# Patient Record
Sex: Female | Born: 1945
Health system: Southern US, Community
[De-identification: ages and names within clinical notes are randomized; demographics above are authoritative.]

## PROBLEM LIST (undated history)

## (undated) DIAGNOSIS — H269 Unspecified cataract: Secondary | ICD-10-CM

## (undated) DIAGNOSIS — T7840XA Allergy, unspecified, initial encounter: Secondary | ICD-10-CM

## (undated) DIAGNOSIS — E785 Hyperlipidemia, unspecified: Secondary | ICD-10-CM

## (undated) DIAGNOSIS — N281 Cyst of kidney, acquired: Secondary | ICD-10-CM

## (undated) DIAGNOSIS — K648 Other hemorrhoids: Secondary | ICD-10-CM

## (undated) DIAGNOSIS — A6920 Lyme disease, unspecified: Secondary | ICD-10-CM

## (undated) DIAGNOSIS — C801 Malignant (primary) neoplasm, unspecified: Secondary | ICD-10-CM

## (undated) DIAGNOSIS — K922 Gastrointestinal hemorrhage, unspecified: Secondary | ICD-10-CM

## (undated) DIAGNOSIS — Z860101 Personal history of adenomatous and serrated colon polyps: Secondary | ICD-10-CM

## (undated) DIAGNOSIS — E041 Nontoxic single thyroid nodule: Secondary | ICD-10-CM

## (undated) DIAGNOSIS — Z86010 Personal history of colonic polyps: Secondary | ICD-10-CM

## (undated) DIAGNOSIS — M8580 Other specified disorders of bone density and structure, unspecified site: Secondary | ICD-10-CM

## (undated) DIAGNOSIS — K5730 Diverticulosis of large intestine without perforation or abscess without bleeding: Secondary | ICD-10-CM

## (undated) HISTORY — DX: Diverticulosis of large intestine without perforation or abscess without bleeding: K57.30

## (undated) HISTORY — DX: Personal history of adenomatous and serrated colon polyps: Z86.0101

## (undated) HISTORY — PX: FRACTURE SURGERY: SHX138

## (undated) HISTORY — DX: Personal history of colonic polyps: Z86.010

## (undated) HISTORY — PX: COSMETIC SURGERY: SHX468

## (undated) HISTORY — DX: Allergy, unspecified, initial encounter: T78.40XA

## (undated) HISTORY — DX: Lyme disease, unspecified: A69.20

## (undated) HISTORY — PX: OTHER SURGICAL HISTORY: SHX169

## (undated) HISTORY — PX: RHINOPLASTY: SUR1284

## (undated) HISTORY — PX: COLONOSCOPY: SHX174

## (undated) HISTORY — DX: Gastrointestinal hemorrhage, unspecified: K92.2

## (undated) HISTORY — PX: WRIST FRACTURE SURGERY: SHX121

## (undated) HISTORY — DX: Other hemorrhoids: K64.8

## (undated) HISTORY — DX: Cyst of kidney, acquired: N28.1

## (undated) HISTORY — DX: Other specified disorders of bone density and structure, unspecified site: M85.80

## (undated) HISTORY — DX: Nontoxic single thyroid nodule: E04.1

## (undated) HISTORY — DX: Malignant (primary) neoplasm, unspecified: C80.1

## (undated) HISTORY — PX: SPINE SURGERY: SHX786

## (undated) HISTORY — DX: Hyperlipidemia, unspecified: E78.5

## (undated) HISTORY — DX: Unspecified cataract: H26.9

---

## 1996-03-23 HISTORY — PX: ABDOMINAL HYSTERECTOMY: SHX81

## 2001-04-07 ENCOUNTER — Encounter: Payer: Self-pay | Admitting: Sports Medicine

## 2001-04-07 ENCOUNTER — Encounter: Admission: RE | Admit: 2001-04-07 | Discharge: 2001-04-07 | Payer: Self-pay | Admitting: Sports Medicine

## 2001-04-22 HISTORY — PX: HEMORROIDECTOMY: SUR656

## 2001-05-21 ENCOUNTER — Encounter (INDEPENDENT_AMBULATORY_CARE_PROVIDER_SITE_OTHER): Payer: Self-pay | Admitting: Specialist

## 2001-05-21 ENCOUNTER — Encounter (INDEPENDENT_AMBULATORY_CARE_PROVIDER_SITE_OTHER): Payer: Self-pay | Admitting: *Deleted

## 2001-05-21 ENCOUNTER — Observation Stay: Admission: RE | Admit: 2001-05-21 | Discharge: 2001-05-22 | Payer: Self-pay | Admitting: General Surgery

## 2002-03-15 ENCOUNTER — Other Ambulatory Visit: Admission: RE | Admit: 2002-03-15 | Discharge: 2002-03-15 | Payer: Self-pay | Admitting: Family Medicine

## 2002-04-12 ENCOUNTER — Encounter: Admission: RE | Admit: 2002-04-12 | Discharge: 2002-04-12 | Payer: Self-pay | Admitting: Family Medicine

## 2002-04-12 ENCOUNTER — Encounter: Payer: Self-pay | Admitting: Family Medicine

## 2003-03-29 ENCOUNTER — Other Ambulatory Visit: Admission: RE | Admit: 2003-03-29 | Discharge: 2003-03-29 | Payer: Self-pay | Admitting: Family Medicine

## 2003-04-19 ENCOUNTER — Encounter (INDEPENDENT_AMBULATORY_CARE_PROVIDER_SITE_OTHER): Payer: Self-pay | Admitting: *Deleted

## 2003-04-19 ENCOUNTER — Encounter: Payer: Self-pay | Admitting: Family Medicine

## 2003-04-19 ENCOUNTER — Encounter: Admission: RE | Admit: 2003-04-19 | Discharge: 2003-04-19 | Payer: Self-pay | Admitting: Family Medicine

## 2004-05-18 ENCOUNTER — Encounter: Admission: RE | Admit: 2004-05-18 | Discharge: 2004-05-18 | Payer: Self-pay | Admitting: Family Medicine

## 2004-06-13 ENCOUNTER — Encounter: Admission: RE | Admit: 2004-06-13 | Discharge: 2004-06-13 | Payer: Self-pay | Admitting: Family Medicine

## 2004-06-13 ENCOUNTER — Encounter (INDEPENDENT_AMBULATORY_CARE_PROVIDER_SITE_OTHER): Payer: Self-pay | Admitting: *Deleted

## 2005-05-07 ENCOUNTER — Other Ambulatory Visit: Admission: RE | Admit: 2005-05-07 | Discharge: 2005-05-07 | Payer: Self-pay | Admitting: Family Medicine

## 2005-05-07 ENCOUNTER — Ambulatory Visit: Payer: Self-pay | Admitting: Family Medicine

## 2005-05-17 ENCOUNTER — Ambulatory Visit: Payer: Self-pay | Admitting: Family Medicine

## 2005-05-21 ENCOUNTER — Ambulatory Visit (HOSPITAL_COMMUNITY): Admission: RE | Admit: 2005-05-21 | Discharge: 2005-05-21 | Payer: Self-pay | Admitting: Family Medicine

## 2005-05-22 ENCOUNTER — Ambulatory Visit: Payer: Self-pay | Admitting: Family Medicine

## 2005-05-22 LAB — FECAL OCCULT BLOOD, GUAIAC: Fecal Occult Blood: NEGATIVE

## 2005-05-31 ENCOUNTER — Encounter: Admission: RE | Admit: 2005-05-31 | Discharge: 2005-05-31 | Payer: Self-pay | Admitting: Family Medicine

## 2006-03-19 ENCOUNTER — Ambulatory Visit: Payer: Self-pay | Admitting: Family Medicine

## 2006-03-20 ENCOUNTER — Encounter: Admission: RE | Admit: 2006-03-20 | Discharge: 2006-03-20 | Payer: Self-pay | Admitting: Family Medicine

## 2006-05-14 ENCOUNTER — Encounter: Payer: Self-pay | Admitting: Family Medicine

## 2006-05-14 ENCOUNTER — Ambulatory Visit: Payer: Self-pay | Admitting: Family Medicine

## 2006-05-14 ENCOUNTER — Other Ambulatory Visit: Admission: RE | Admit: 2006-05-14 | Discharge: 2006-05-14 | Payer: Self-pay | Admitting: Family Medicine

## 2006-05-14 LAB — CONVERTED CEMR LAB: Pap Smear: NORMAL

## 2006-05-29 ENCOUNTER — Encounter: Admission: RE | Admit: 2006-05-29 | Discharge: 2006-05-29 | Payer: Self-pay | Admitting: Family Medicine

## 2006-06-17 ENCOUNTER — Encounter: Admission: RE | Admit: 2006-06-17 | Discharge: 2006-06-17 | Payer: Self-pay | Admitting: Family Medicine

## 2006-06-20 ENCOUNTER — Ambulatory Visit: Payer: Self-pay | Admitting: Internal Medicine

## 2006-07-16 ENCOUNTER — Encounter (INDEPENDENT_AMBULATORY_CARE_PROVIDER_SITE_OTHER): Payer: Self-pay | Admitting: *Deleted

## 2006-07-16 ENCOUNTER — Ambulatory Visit: Payer: Self-pay | Admitting: Internal Medicine

## 2006-09-17 ENCOUNTER — Ambulatory Visit: Payer: Self-pay | Admitting: Family Medicine

## 2006-10-01 ENCOUNTER — Ambulatory Visit: Payer: Self-pay | Admitting: Family Medicine

## 2007-06-23 DIAGNOSIS — A6920 Lyme disease, unspecified: Secondary | ICD-10-CM

## 2007-06-23 HISTORY — DX: Lyme disease, unspecified: A69.20

## 2007-07-21 ENCOUNTER — Encounter: Payer: Self-pay | Admitting: Family Medicine

## 2007-07-21 DIAGNOSIS — R799 Abnormal finding of blood chemistry, unspecified: Secondary | ICD-10-CM | POA: Insufficient documentation

## 2007-07-21 DIAGNOSIS — K5909 Other constipation: Secondary | ICD-10-CM | POA: Insufficient documentation

## 2007-07-21 DIAGNOSIS — M8580 Other specified disorders of bone density and structure, unspecified site: Secondary | ICD-10-CM | POA: Insufficient documentation

## 2007-07-21 DIAGNOSIS — E785 Hyperlipidemia, unspecified: Secondary | ICD-10-CM | POA: Insufficient documentation

## 2007-07-21 DIAGNOSIS — Z85828 Personal history of other malignant neoplasm of skin: Secondary | ICD-10-CM | POA: Insufficient documentation

## 2007-07-21 DIAGNOSIS — M353 Polymyalgia rheumatica: Secondary | ICD-10-CM | POA: Insufficient documentation

## 2007-07-21 DIAGNOSIS — K649 Unspecified hemorrhoids: Secondary | ICD-10-CM | POA: Insufficient documentation

## 2007-08-10 ENCOUNTER — Encounter: Payer: Self-pay | Admitting: Family Medicine

## 2007-08-10 ENCOUNTER — Other Ambulatory Visit: Admission: RE | Admit: 2007-08-10 | Discharge: 2007-08-10 | Payer: Self-pay | Admitting: Family Medicine

## 2007-08-10 ENCOUNTER — Ambulatory Visit: Payer: Self-pay | Admitting: Family Medicine

## 2007-08-10 DIAGNOSIS — Z8619 Personal history of other infectious and parasitic diseases: Secondary | ICD-10-CM | POA: Insufficient documentation

## 2007-08-10 DIAGNOSIS — A6920 Lyme disease, unspecified: Secondary | ICD-10-CM | POA: Insufficient documentation

## 2007-08-11 LAB — CONVERTED CEMR LAB
ALT: 21 units/L (ref 0–35)
AST: 26 units/L (ref 0–37)
Albumin: 4.4 g/dL (ref 3.5–5.2)
Alkaline Phosphatase: 90 units/L (ref 39–117)
BUN: 11 mg/dL (ref 6–23)
Basophils Absolute: 0 10*3/uL (ref 0.0–0.1)
Basophils Relative: 0.4 % (ref 0.0–1.0)
Bilirubin, Direct: 0.1 mg/dL (ref 0.0–0.3)
CO2: 31 meq/L (ref 19–32)
Calcium: 9.7 mg/dL (ref 8.4–10.5)
Chloride: 105 meq/L (ref 96–112)
Cholesterol: 225 mg/dL (ref 0–200)
Creatinine, Ser: 0.8 mg/dL (ref 0.4–1.2)
Direct LDL: 138.8 mg/dL
Eosinophils Absolute: 0 10*3/uL (ref 0.0–0.6)
Eosinophils Relative: 0.5 % (ref 0.0–5.0)
GFR calc Af Amer: 93 mL/min
GFR calc non Af Amer: 77 mL/min
Glucose, Bld: 101 mg/dL — ABNORMAL HIGH (ref 70–99)
HCT: 40.4 % (ref 36.0–46.0)
HDL: 71.1 mg/dL (ref >39.0)
Hemoglobin: 13.8 g/dL (ref 12.0–15.0)
Lymphocytes Relative: 23.3 % (ref 12.0–46.0)
MCHC: 34.3 g/dL (ref 30.0–36.0)
MCV: 84.7 fL (ref 78.0–100.0)
Monocytes Absolute: 0.4 10*3/uL (ref 0.2–0.7)
Monocytes Relative: 6.5 % (ref 3.0–11.0)
Neutro Abs: 4.2 10*3/uL (ref 1.4–7.7)
Neutrophils Relative %: 69.3 % (ref 43.0–77.0)
Platelets: 212 10*3/uL (ref 150–400)
Potassium: 4.4 meq/L (ref 3.5–5.1)
RBC: 4.77 M/uL (ref 3.87–5.11)
RDW: 13.3 % (ref 11.5–14.6)
Sodium: 143 meq/L (ref 135–145)
TSH: 1.57 microintl units/mL (ref 0.35–5.50)
Total Bilirubin: 0.9 mg/dL (ref 0.3–1.2)
Total CHOL/HDL Ratio: 3.2
Total Protein: 7.4 g/dL (ref 6.0–8.3)
Triglycerides: 69 mg/dL (ref 0–149)
VLDL: 14 mg/dL (ref 0–40)
WBC: 6 10*3/uL (ref 4.5–10.5)

## 2007-09-08 ENCOUNTER — Telehealth: Payer: Self-pay | Admitting: Family Medicine

## 2007-09-08 ENCOUNTER — Encounter: Admission: RE | Admit: 2007-09-08 | Discharge: 2007-09-08 | Payer: Self-pay | Admitting: Family Medicine

## 2007-09-10 ENCOUNTER — Encounter (INDEPENDENT_AMBULATORY_CARE_PROVIDER_SITE_OTHER): Payer: Self-pay | Admitting: *Deleted

## 2008-06-27 ENCOUNTER — Encounter: Payer: Self-pay | Admitting: Family Medicine

## 2008-06-30 ENCOUNTER — Encounter: Payer: Self-pay | Admitting: Family Medicine

## 2008-08-11 ENCOUNTER — Ambulatory Visit: Payer: Self-pay | Admitting: Family Medicine

## 2008-08-12 LAB — CONVERTED CEMR LAB
ALT: 30 units/L (ref 0–35)
AST: 32 units/L (ref 0–37)
Cholesterol: 208 mg/dL (ref 0–200)
Direct LDL: 110 mg/dL
HDL: 66.9 mg/dL (ref >39.0)
Total CHOL/HDL Ratio: 3.1
Triglycerides: 50 mg/dL (ref 0–149)
VLDL: 10 mg/dL (ref 0–40)

## 2008-08-23 LAB — HM DEXA SCAN

## 2008-09-08 ENCOUNTER — Encounter: Admission: RE | Admit: 2008-09-08 | Discharge: 2008-09-08 | Payer: Self-pay | Admitting: Family Medicine

## 2008-09-16 ENCOUNTER — Encounter: Payer: Self-pay | Admitting: Family Medicine

## 2009-06-16 ENCOUNTER — Encounter: Payer: Self-pay | Admitting: Family Medicine

## 2009-06-22 ENCOUNTER — Ambulatory Visit (HOSPITAL_BASED_OUTPATIENT_CLINIC_OR_DEPARTMENT_OTHER): Admission: RE | Admit: 2009-06-22 | Discharge: 2009-06-23 | Payer: Self-pay | Admitting: Orthopedic Surgery

## 2009-06-29 ENCOUNTER — Encounter: Payer: Self-pay | Admitting: Family Medicine

## 2009-07-07 ENCOUNTER — Encounter: Payer: Self-pay | Admitting: Family Medicine

## 2009-07-24 ENCOUNTER — Encounter: Payer: Self-pay | Admitting: Family Medicine

## 2009-08-04 ENCOUNTER — Encounter: Payer: Self-pay | Admitting: Family Medicine

## 2009-08-11 ENCOUNTER — Other Ambulatory Visit: Admission: RE | Admit: 2009-08-11 | Discharge: 2009-08-11 | Payer: Self-pay | Admitting: Family Medicine

## 2009-08-11 ENCOUNTER — Ambulatory Visit: Payer: Self-pay | Admitting: Family Medicine

## 2009-08-11 DIAGNOSIS — N949 Unspecified condition associated with female genital organs and menstrual cycle: Secondary | ICD-10-CM | POA: Insufficient documentation

## 2009-08-11 LAB — CONVERTED CEMR LAB
KOH Prep: NEGATIVE
Whiff Test: NEGATIVE

## 2009-08-11 LAB — HM PAP SMEAR

## 2009-08-15 ENCOUNTER — Encounter (INDEPENDENT_AMBULATORY_CARE_PROVIDER_SITE_OTHER): Payer: Self-pay | Admitting: *Deleted

## 2009-08-15 LAB — CONVERTED CEMR LAB
ALT: 24 units/L (ref 0–35)
AST: 32 units/L (ref 0–37)
Albumin: 4.6 g/dL (ref 3.5–5.2)
Alkaline Phosphatase: 95 units/L (ref 39–117)
BUN: 16 mg/dL (ref 6–23)
Basophils Absolute: 0 10*3/uL (ref 0.0–0.1)
Basophils Relative: 0.1 % (ref 0.0–3.0)
Bilirubin, Direct: 0 mg/dL (ref 0.0–0.3)
CO2: 31 meq/L (ref 19–32)
Calcium: 9.5 mg/dL (ref 8.4–10.5)
Chloride: 108 meq/L (ref 96–112)
Cholesterol: 262 mg/dL — ABNORMAL HIGH (ref 0–200)
Creatinine, Ser: 0.9 mg/dL (ref 0.4–1.2)
Direct LDL: 166.7 mg/dL
Eosinophils Absolute: 0 10*3/uL (ref 0.0–0.7)
Eosinophils Relative: 1 % (ref 0.0–5.0)
GFR calc non Af Amer: 66.9 mL/min (ref >60)
Glucose, Bld: 101 mg/dL — ABNORMAL HIGH (ref 70–99)
HCT: 38 % (ref 36.0–46.0)
HDL: 73.3 mg/dL (ref >39.00)
Hemoglobin: 12.7 g/dL (ref 12.0–15.0)
Lymphocytes Relative: 29.1 % (ref 12.0–46.0)
Lymphs Abs: 1.3 10*3/uL (ref 0.7–4.0)
MCHC: 33.5 g/dL (ref 30.0–36.0)
MCV: 87.7 fL (ref 78.0–100.0)
Monocytes Absolute: 0.5 10*3/uL (ref 0.1–1.0)
Monocytes Relative: 10.6 % (ref 3.0–12.0)
Neutro Abs: 2.5 10*3/uL (ref 1.4–7.7)
Neutrophils Relative %: 59.2 % (ref 43.0–77.0)
Platelets: 173 10*3/uL (ref 150.0–400.0)
Potassium: 4.4 meq/L (ref 3.5–5.1)
RBC: 4.33 M/uL (ref 3.87–5.11)
RDW: 14.4 % (ref 11.5–14.6)
Sodium: 144 meq/L (ref 135–145)
TSH: 1.51 microintl units/mL (ref 0.35–5.50)
Total Bilirubin: 0.9 mg/dL (ref 0.3–1.2)
Total CHOL/HDL Ratio: 4
Total Protein: 7.6 g/dL (ref 6.0–8.3)
Triglycerides: 89 mg/dL (ref 0.0–149.0)
VLDL: 17.8 mg/dL (ref 0.0–40.0)
Vit D, 25-Hydroxy: 49 ng/mL (ref 30–89)
WBC: 4.3 10*3/uL — ABNORMAL LOW (ref 4.5–10.5)

## 2009-09-01 ENCOUNTER — Encounter: Payer: Self-pay | Admitting: Family Medicine

## 2009-09-11 ENCOUNTER — Encounter: Admission: RE | Admit: 2009-09-11 | Discharge: 2009-09-11 | Payer: Self-pay | Admitting: Family Medicine

## 2009-09-12 ENCOUNTER — Telehealth: Payer: Self-pay | Admitting: Family Medicine

## 2009-09-13 ENCOUNTER — Encounter (INDEPENDENT_AMBULATORY_CARE_PROVIDER_SITE_OTHER): Payer: Self-pay | Admitting: *Deleted

## 2009-10-02 ENCOUNTER — Encounter: Payer: Self-pay | Admitting: Family Medicine

## 2009-11-02 ENCOUNTER — Encounter: Payer: Self-pay | Admitting: Family Medicine

## 2009-11-13 ENCOUNTER — Ambulatory Visit: Payer: Self-pay | Admitting: Family Medicine

## 2009-11-13 ENCOUNTER — Telehealth: Payer: Self-pay | Admitting: Family Medicine

## 2009-11-13 DIAGNOSIS — N63 Unspecified lump in unspecified breast: Secondary | ICD-10-CM | POA: Insufficient documentation

## 2009-11-14 LAB — CONVERTED CEMR LAB
ALT: 21 units/L (ref 0–35)
AST: 25 units/L (ref 0–37)
Cholesterol: 222 mg/dL — ABNORMAL HIGH (ref 0–200)
Direct LDL: 136.1 mg/dL
HDL: 73.6 mg/dL (ref >39.00)
Total CHOL/HDL Ratio: 3
Triglycerides: 72 mg/dL (ref 0.0–149.0)
VLDL: 14.4 mg/dL (ref 0.0–40.0)

## 2009-11-17 ENCOUNTER — Encounter: Admission: RE | Admit: 2009-11-17 | Discharge: 2009-11-17 | Payer: Self-pay | Admitting: Family Medicine

## 2009-12-14 ENCOUNTER — Encounter: Payer: Self-pay | Admitting: Family Medicine

## 2010-01-04 ENCOUNTER — Encounter: Payer: Self-pay | Admitting: Family Medicine

## 2010-01-16 ENCOUNTER — Ambulatory Visit (HOSPITAL_BASED_OUTPATIENT_CLINIC_OR_DEPARTMENT_OTHER): Admission: RE | Admit: 2010-01-16 | Discharge: 2010-01-16 | Payer: Self-pay | Admitting: Orthopedic Surgery

## 2010-01-23 ENCOUNTER — Encounter: Payer: Self-pay | Admitting: Family Medicine

## 2010-08-09 ENCOUNTER — Ambulatory Visit: Payer: Self-pay | Admitting: Family Medicine

## 2010-08-09 ENCOUNTER — Telehealth (INDEPENDENT_AMBULATORY_CARE_PROVIDER_SITE_OTHER): Payer: Self-pay | Admitting: *Deleted

## 2010-08-09 LAB — CONVERTED CEMR LAB
ALT: 18 units/L (ref 0–35)
AST: 23 units/L (ref 0–37)
Albumin: 4.5 g/dL (ref 3.5–5.2)
Alkaline Phosphatase: 75 units/L (ref 39–117)
BUN: 14 mg/dL (ref 6–23)
Bilirubin, Direct: 0.1 mg/dL (ref 0.0–0.3)
CO2: 33 meq/L — ABNORMAL HIGH (ref 19–32)
Calcium: 9.9 mg/dL (ref 8.4–10.5)
Chloride: 104 meq/L (ref 96–112)
Cholesterol: 244 mg/dL — ABNORMAL HIGH (ref 0–200)
Creatinine, Ser: 0.7 mg/dL (ref 0.4–1.2)
Direct LDL: 160.5 mg/dL
GFR calc non Af Amer: 93.76 mL/min (ref >60)
Glucose, Bld: 87 mg/dL (ref 70–99)
HDL: 68.2 mg/dL (ref >39.00)
Phosphorus: 3.9 mg/dL (ref 2.3–4.6)
Potassium: 4.9 meq/L (ref 3.5–5.1)
Sodium: 143 meq/L (ref 135–145)
TSH: 2.16 microintl units/mL (ref 0.35–5.50)
Total Bilirubin: 0.8 mg/dL (ref 0.3–1.2)
Total CHOL/HDL Ratio: 4
Total Protein: 6.9 g/dL (ref 6.0–8.3)
Triglycerides: 81 mg/dL (ref 0.0–149.0)
VLDL: 16.2 mg/dL (ref 0.0–40.0)

## 2010-08-12 LAB — CONVERTED CEMR LAB: Vit D, 25-Hydroxy: 47 ng/mL (ref 30–89)

## 2010-08-21 ENCOUNTER — Ambulatory Visit: Payer: Self-pay | Admitting: Family Medicine

## 2010-08-21 ENCOUNTER — Telehealth: Payer: Self-pay | Admitting: Internal Medicine

## 2010-08-21 ENCOUNTER — Encounter: Payer: Self-pay | Admitting: Internal Medicine

## 2010-08-21 DIAGNOSIS — K921 Melena: Secondary | ICD-10-CM | POA: Insufficient documentation

## 2010-08-23 DIAGNOSIS — Z860100 Personal history of colon polyps, unspecified: Secondary | ICD-10-CM | POA: Insufficient documentation

## 2010-08-23 DIAGNOSIS — K5730 Diverticulosis of large intestine without perforation or abscess without bleeding: Secondary | ICD-10-CM | POA: Insufficient documentation

## 2010-08-30 ENCOUNTER — Ambulatory Visit: Payer: Self-pay | Admitting: Internal Medicine

## 2010-09-18 ENCOUNTER — Encounter: Admission: RE | Admit: 2010-09-18 | Discharge: 2010-09-18 | Payer: Self-pay | Admitting: Family Medicine

## 2010-09-18 LAB — HM MAMMOGRAPHY: HM Mammogram: NORMAL

## 2010-09-20 ENCOUNTER — Encounter (INDEPENDENT_AMBULATORY_CARE_PROVIDER_SITE_OTHER): Payer: Self-pay | Admitting: *Deleted

## 2010-09-20 ENCOUNTER — Encounter: Payer: Self-pay | Admitting: Family Medicine

## 2010-10-05 ENCOUNTER — Ambulatory Visit: Payer: Self-pay | Admitting: Internal Medicine

## 2010-10-05 ENCOUNTER — Ambulatory Visit (HOSPITAL_COMMUNITY): Admission: RE | Admit: 2010-10-05 | Discharge: 2010-10-05 | Payer: Self-pay | Admitting: Internal Medicine

## 2010-10-05 LAB — HM COLONOSCOPY

## 2010-11-20 ENCOUNTER — Ambulatory Visit: Payer: Self-pay | Admitting: Internal Medicine

## 2010-11-21 ENCOUNTER — Ambulatory Visit: Payer: Self-pay | Admitting: Family Medicine

## 2010-11-21 ENCOUNTER — Telehealth (INDEPENDENT_AMBULATORY_CARE_PROVIDER_SITE_OTHER): Payer: Self-pay | Admitting: *Deleted

## 2010-11-26 LAB — CONVERTED CEMR LAB
ALT: 20 U/L
AST: 26 U/L
Cholesterol: 244 mg/dL — ABNORMAL HIGH
Direct LDL: 142.3 mg/dL
HDL: 77.5 mg/dL
Total CHOL/HDL Ratio: 3
Triglycerides: 57 mg/dL
VLDL: 11.4 mg/dL

## 2011-01-13 ENCOUNTER — Encounter: Payer: Self-pay | Admitting: Family Medicine

## 2011-01-22 NOTE — Letter (Signed)
Summary: [facility]   Imported By: Sherian Rein 01/29/2010 14:22:49  _____________________________________________________________________  External Attachment:    Type:   Image     Comment:   External Document

## 2011-01-22 NOTE — Miscellaneous (Signed)
  Clinical Lists Changes  Observations: Added new observation of MAMMO DUE: 09/2011 (09/20/2010 16:08) Added new observation of DM PROGRESS: N/A (09/20/2010 16:08) Added new observation of DM FSREVIEW: N/A (09/20/2010 16:08) Added new observation of HTN PROGRESS: N/A (09/20/2010 16:08) Added new observation of HTN FSREVIEW: N/A (09/20/2010 16:08) Added new observation of MAMMOGRAM: normal (09/18/2010 16:09)      Prevention & Chronic Care Immunizations   Influenza vaccine: Not documented   Influenza vaccine deferral: patient declined  (08/21/2010)    Tetanus booster: 03/29/2003: Td    Pneumococcal vaccine: Not documented   Pneumococcal vaccine deferral: patient declined  (08/21/2010)    H. zoster vaccine: Not documented   H. zoster vaccine deferral: declined  (08/21/2010)  Colorectal Screening   Hemoccult: Negative  (05/22/2005)    Colonoscopy: Adenomatous Polyp  (12/23/2005)   Colonoscopy due: 12/2010  Other Screening   Pap smear: NEGATIVE FOR INTRAEPITHELIAL LESIONS OR MALIGNANCY.  (08/11/2009)    Mammogram: normal  (09/18/2010)   Mammogram action/deferral: Screening mammogram in 1 year.     (09/08/2008)   Mammogram due: 09/2011    DXA bone density scan: osteopenia  (09/18/2010)   Smoking status: never  (07/21/2007)  Lipids   Total Cholesterol: 244  (08/09/2010)   LDL: DEL  (08/11/2008)   LDL Direct: 160.5  (08/09/2010)   HDL: 68.20  (08/09/2010)   Triglycerides: 81.0  (08/09/2010)    SGOT (AST): 23  (08/09/2010)   SGPT (ALT): 18  (08/09/2010)   Alkaline phosphatase: 75  (08/09/2010)   Total bilirubin: 0.8  (08/09/2010)  Self-Management Support :    Lipid self-management support: Not documented     Preventive Care Screening  Mammogram:    Date:  09/18/2010    Next Due:  09/2011    Results:  normal

## 2011-01-22 NOTE — Procedures (Signed)
Summary: Colon Prep/[facility]  Colon Prep/[facility]   Imported By: Lester Lookeba 09/03/2010 11:25:48  _____________________________________________________________________  External Attachment:    Type:   Image     Comment:   External Document

## 2011-01-22 NOTE — Progress Notes (Signed)
Summary: triage  Phone Note From Other Clinic Call back at [phone]   Caller: Lupita Leash, scheduler Call For: Dr. Juanda Chance Reason for Call: Schedule Patient Appt Summary of Call: Dr. Milinda Antis would like pt seen for blood in stool... ok to see PA or NP Initial call taken by: Vallarie Mare,  August 21, 2010 11:34 AM  Follow-up for Phone Call        Scheduled to see Juanda Chance 08/30/10 9:15.  new patient letter mailed to the patient  Follow-up by: Darcey Nora RN, CGRN,  August 21, 2010 11:52 AM

## 2011-01-22 NOTE — Letter (Signed)
Summary: New Patient letter  [facility]  [address] [place], [place] [address]   Phone: [phone]  Fax: [phone]       08/21/2010 MRN: [id number]  [facility] [place] [address] [place], [place]  [address]  Dear Ms. Soy,  Welcome to the Gastroenterology Division at [facility].    You are scheduled to see Dr.  Juanda Chance  on 08/30/10 at 9:15  on the 3rd floor at [facility], [address]  We ask that you try to arrive at our office 15 minutes prior to your appointment time to allow for check-in.  We would like you to complete the enclosed self-administered evaluation form prior to your visit and bring it with you on the day of your appointment.  We will review it with you.  Also, please bring a complete list of all your medications or, if you prefer, bring the medication bottles and we will list them.  Please bring your insurance card so that we may make a copy of it.  If your insurance requires a referral to see a specialist, please bring your referral form from your primary care physician.  Co-payments are due at the time of your visit and may be paid by cash, check or credit card.     Your office visit will consist of a consult with your physician (includes a physical exam), any laboratory testing he/she may order, scheduling of any necessary diagnostic testing (e.g. x-ray, ultrasound, CT-scan), and scheduling of a procedure (e.g. Endoscopy, Colonoscopy) if required.  Please allow enough time on your schedule to allow for any/all of these possibilities.    If you cannot keep your appointment, please call [phone] to cancel or reschedule prior to your appointment date.  This allows [place] the opportunity to schedule an appointment for another patient in need of care.  If you do not cancel or reschedule by 5 p.m. the business day prior to your appointment date, you will be charged a $50.00 late cancellation/no-show fee.    Thank you for choosing [facility] for your medical needs.  We appreciate the opportunity to care for you.  Please visit [place] at our website  to learn more about our practice.                     Sincerely,                                                             The Gastroenterology Division

## 2011-01-22 NOTE — Assessment & Plan Note (Signed)
Summary: blood in stool/Melissa Blackwell   History of Present Illness Visit Type: Initial Consult Primary GI MD: Lina Sar MD Primary Sulema Braid: Cathey Endow, MD Requesting Zaahir Pickney: Roxy Manns, MD Chief Complaint: BRB on tissue and clothing x 3 every 2 months History of Present Illness:   This is a 65 year old white female with several episodes of painless rectal bleeding of bright red blood which occurs with bowel movements but also when she urinates. The blood is usually on the toilet tissue. The last episode occurred 6 weeks ago. She has a known history of hemorrhoids. In 2002, the patient underwent a hemorrhoidectomy by Dr. Earlene Plater which involved internal as well as external hemorrhoids. A colonoscopy at that time showed adenomatous polyps. A repeat colonoscopy in 2007 again showed tubular adenomas. A CT Scan of the abdomen in June 2005 showed diverticulosis. An upper abdominal ultrasound showed a large right renal cyst. She is chronically constipated, using herbal laxatives on a daily basis. There is no family history of colon cancer.   GI Review of Systems      Denies abdominal pain, acid reflux, belching, bloating, chest pain, dysphagia with liquids, dysphagia with solids, heartburn, loss of appetite, nausea, vomiting, vomiting blood, weight loss, and  weight gain.      Reports hemorrhoids and  rectal bleeding.     Denies anal fissure, black tarry stools, change in bowel habit, constipation, diarrhea, diverticulosis, fecal incontinence, heme positive stool, irritable bowel syndrome, jaundice, light color stool, liver problems, and  rectal pain.    Current Medications (verified): 1)  [place] Herbal Tx (Lyme Disease) 2)  Mvi .Marland Kitchen.. 1 By Mouth Qd 3)  Calcium Carbonate 600 Mg Tabs (Calcium Carbonate) .... One Tablet By Mouth Twice A Day 4)  Vitamin K 2 .... Take 1 Tablet By Mouth Once A Day 5)  Pa Resveratrol 250 Mg Caps (Resveratrol) .... Take 1 Tablet By Mouth Once Daily 6)  Nac .... Take 1 Tablet  By Mouth Once A Day  Allergies (verified): No Known Drug Allergies  Past History:  Past Medical History: Skin cancer, hx of- squamous cell, in situ Hyperlipidemia Osteopenia cardiac work up with stress test 08 [facility] Lyme disease07-08 Lyme specialist-- Dr Abner Greenspan Adenomatous Colon Polyps GI Bleed  Past Surgical History: Reviewed history from 08/23/2010 and no changes required. Hemorrhoidectomy (04/2001) Hysterectomy- partial, fibroids (03/1996) Removed keloid scar Spinal fusion Rhinoplasty  Colonoscopy- polyps, diverticulosis, hemorrhoids (04/2001) Dexa- osteopenia hip (04/2001) [place] Abd- large right kidney cyst (03/2003) Dexa- osteopenia hip, worse (03/2003) Right renal cyst Life line screen- PVD, carotid , heel dexa negative (01/2006) Osteopenia slightly decreased 905/2007) dexa 9/09 LS -.78 (improved) hip - 2.13 (worse)  Family History: Reviewed history from 08/23/2010 and no changes required. Father: deceased- heart problems, CHF Mother: pancreatic cancer, died at 19 of heart disease Siblings:  sister- chol uncle cancer ? uncle with DM Family History of Heart Disease: Father, Maternal Grandfather Family History of Uterine Cancer: Sister  Social History: Reviewed history from 08/11/2008 and no changes required. Marital Status: married Children: 1 daughter Occupation: retired non smoker  no alcohol   Review of Systems       The patient complains of night sweats.  The patient denies allergy/sinus, anemia, anxiety-new, arthritis/joint pain, back pain, blood in urine, breast changes/lumps, change in vision, confusion, cough, coughing up blood, depression-new, fainting, fatigue, fever, headaches-new, hearing problems, heart murmur, heart rhythm changes, itching, menstrual pain, muscle pains/cramps, nosebleeds, pregnancy symptoms, shortness of breath, skin rash, sleeping problems, sore throat, swelling of feet/legs,  swollen lymph glands, thirst - excessive ,  urination - excessive , urination changes/pain, urine leakage, vision changes, and voice change.         Pertinent positive and negative review of systems were noted in the above HPI. All other ROS was otherwise negative.   Vital Signs:  Patient profile:   65 year old female Height:      64 inches Weight:      130.25 pounds BMI:     22.44 Pulse rate:   60 / minute Pulse rhythm:   regular BP sitting:   92 / 58  (left arm) Cuff size:   regular  Vitals Entered By: June McMurray CMA Duncan Dull) (August 30, 2010 9:33 AM)  Physical Exam  General:  Well developed, well nourished, no acute distress. Eyes:  PERRLA, no icterus. Mouth:  No deformity or lesions, dentition normal. Neck:  Supple; no masses or thyromegaly. Lungs:  Clear throughout to auscultation. Heart:  Regular rate and rhythm; no murmurs, rubs,  or bruits. Abdomen:  Soft, relaxed abdomen with normoactive bowel sounds and no tenderness. Liver edge at costal margin. Rectal:  rectal and anoscopic exam reveals large skin tags at the anal orifice, normal sphincter tone, second degree hemorrhoids partially prolapsing through the anal canal which is rather hyperemic, bluish and erythematous. There were small hemorrhoids internally. Stool is Hemoccult negative. Extremities:  No clubbing, cyanosis, edema or deformities noted. Skin:  Intact without significant lesions or rashes. Psych:  Alert and cooperative. Normal mood and affect.   Impression & Recommendations:  Problem # 1:  BLOOD IN STOOL (ICD-578.1)  Patient has low volume bleeding with a history of internal hemorrhoids. She is status post remote hemorrhoidectomy. There are hemorrhoids confirmed today on anoscopic exam. Patient is worried about the possibility of recurrent polyps. She would be due for a recall colonoscopy next year but we will proceed with a colon exam this time. She needs to start [facility] suppositories, one at bedtime.  Orders: ZCOL Banding (ZCOL  Band)  Problem # 2:  COLONIC POLYPS, ADENOMATOUS, HX OF (ICD-V12.72)  Patient had adenomatous polyps in 2000 and again in 2007. We will schedule her for a colonoscopy.  Orders: ZCOL Banding (ZCOL Band)  Patient Instructions: 1)  Anusol-HC suppositories, one at bedtime. 2)  Colonoscopy has been scheduled. 3)  Possible hemorrhoidal band ligation may need to be during her colonoscopy. 4)  Copy sent to : Dr Milinda Antis 5)  The medication list was reviewed and reconciled.  All changed / newly prescribed medications were explained.  A complete medication list was provided to the patient / caregiver. Prescriptions: DULCOLAX 5 MG  TBEC (BISACODYL) Day before procedure take 2 at 3pm and 2 at 8pm.  #4 x 0   Entered by:   Lamona Curl CMA (AAMA)   Authorized by:   Hart Carwin MD   Signed by:   Lamona Curl CMA (AAMA) on 08/30/2010   Method used:   Electronically to        [facility]  [address] [facility]retail[facility]       [address] [place] [address] [place], [place]  [address]       Ph: [phone]       Fax: [phone]   RxID:   [id number] REGLAN 10 MG  TABS (METOCLOPRAMIDE HCL) As per prep instructions.  #2 x 0   Entered by:   Lamona Curl CMA (AAMA)   Authorized by:   Hart Carwin MD  Signed by:   Lamona Curl CMA (AAMA) on 08/30/2010   Method used:   [facility]        [facility]  [address] [facility]       [address] [place] [address]       [place], [place]  [address]       Ph: [phone]       Fax: [phone]   RxID:   [id number] MIRALAX   POWD (POLYETHYLENE GLYCOL 3350) As per prep  instructions.  #255gm x 0   Entered by:   Lamona Curl CMA (AAMA)   Authorized by:   Hart Carwin MD   Signed by:   Lamona Curl CMA (AAMA) on 08/30/2010   Method used:   [facility]        [facility]  [address] [facility]       [address] [place] [address] [place], [place]  [address]       Ph: [phone]       Fax: [phone]   RxID:   [id number] ANUSOL-HC 25 MG SUPP (HYDROCORTISONE ACETATE) Insert 1  suppository into rectum at bedtime as needed for rectal bleeding  #12 x 0   Entered by:   Lamona Curl CMA (AAMA)   Authorized by:   Hart Carwin MD   Signed by:   Lamona Curl CMA (AAMA) on 08/30/2010   Method used:   [facility]        [facility]  [address]       [address] [place] [address]       [place], [place]  [address]       Ph: [phone]       Fax: [phone]   RxID:   [id number]

## 2011-01-22 NOTE — Progress Notes (Signed)
----   Converted from flag ---- ---- 08/09/2010 8:46 AM, Colon Flattery Tower MD wrote: please check vitD and tsh and renal , hepatic, lipids for 272, 733.0 thanks  ---- 08/08/2010 11:43 AM, Mills Koller wrote: Patient is scheduled for a CPX with you, I need lab orders with DX, Please. Thanks, Terri ------------------------------

## 2011-01-22 NOTE — Letter (Signed)
Summary: [facility] Instructions  [facility]  [address] [place], [place] [address]   Phone: [phone]  Fax: [phone]       Melissa Blackwell    1946-07-20    MRN: [id number]       Procedure Day /Date: Friday 09/28/10     Arrival Time: 2:00 pm     Procedure Time: 3:00 pm     Location of Procedure:                    _x _  [facility] (4th Floor)  PREPARATION FOR COLONOSCOPY WITH MIRALAX  Starting 5 days prior to your procedure 09/23/10 do not eat nuts, seeds, popcorn, corn, beans, peas,  salads, or any raw vegetables.  Do not take any fiber supplements (e.g. Metamucil, Citrucel, and Benefiber). ____________________________________________________________________________________________________   THE DAY BEFORE YOUR PROCEDURE         DATE: 09/27/10 DAY: Thursday  1   Drink clear liquids the entire day-NO SOLID FOOD  2   Do not drink anything colored red or purple.  Avoid juices with pulp.  No orange juice.  3   Drink at least 64 oz. (8 glasses) of fluid/clear liquids during the day to prevent dehydration and help the prep work efficiently.  CLEAR LIQUIDS INCLUDE: Water Jello Ice Popsicles Tea (sugar ok, no milk/cream) Powdered fruit flavored drinks Coffee (sugar ok, no milk/cream) Gatorade Juice: apple, white grape, white cranberry  Lemonade Clear bullion, consomm, broth Carbonated beverages (any kind) Strained chicken noodle soup Hard Candy  4   Mix the entire bottle of Miralax with 64 oz. of Gatorade/Powerade in the morning and put in the refrigerator to chill.  5   At 3:00 pm take 2 Dulcolax/Bisacodyl tablets.  6   At 4:30 pm take one Reglan/Metoclopramide tablet.  7  Starting at 5:00 pm drink one 8 oz glass of the Miralax mixture every 15-20 minutes until you have finished drinking the entire 64 oz.  You should finish drinking prep around 7:30 or 8:00 pm.  8   If you are nauseated, you may take the 2nd Reglan/Metoclopramide tablet  at 6:30 pm.        9    At 8:00 pm take 2 more DULCOLAX/Bisacodyl tablets.        THE DAY OF YOUR PROCEDURE      DATE:  09/28/10 DAY: Friday  You may drink clear liquids until 1:00 pm  (2 HOURS BEFORE PROCEDURE).   MEDICATION INSTRUCTIONS  Unless otherwise instructed, you should take regular prescription medications with a small sip of water as early as possible the morning of your procedure.         OTHER INSTRUCTIONS  You will need a responsible adult at least 66 years of age to accompany you and drive you home.   This person must remain in the waiting room during your procedure.  Wear loose fitting clothing that is easily removed.  Leave jewelry and other valuables at home.  However, you may wish to bring a book to read or an iPod/MP3 player to listen to music as you wait for your procedure to start.  Remove all body piercing jewelry and leave at home.  Total time from sign-in until discharge is approximately 2-3 hours.  You should go home directly after your procedure and rest.  You can resume normal activities the day after your procedure.  The day of your procedure you should not:   Drive  Make legal decisions   Operate machinery   Drink alcohol   Return to work  You will receive specific instructions about eating, activities and medications before you leave.   The above instructions have been reviewed and explained to me by   Lamona Curl CMA Duncan Dull)  August 30, 2010 10:39 AM     I fully understand and can verbalize these instructions _____________________________ Date 08/30/10

## 2011-01-22 NOTE — Letter (Signed)
Summary: [facility]   Imported By: Lanelle Bal 12/25/2009 12:33:56  _____________________________________________________________________  External Attachment:    Type:   Image     Comment:   External Document

## 2011-01-22 NOTE — Procedures (Signed)
Summary: Colonoscopy  Patient: Melissa Blackwell Note: All result statuses are Final unless otherwise noted.  Tests: (1) Colonoscopy (COL)   COL Colonoscopy           DONE     [facility]     [address] [place], [place]  [address]           COLONOSCOPY PROCEDURE REPORT           PATIENT:  Melissa Blackwell, Melissa Blackwell  MR#:  [id number]     BIRTHDATE:  02-26-1946, 64 yrs. old  GENDER:  female     ENDOSCOPIST:  Hedwig Morton. Juanda Chance, MD     REF. BY:  Marne A. Milinda Antis, M.D.     PROCEDURE DATE:  10/05/2010     PROCEDURE:  Colonoscopy 57[address]     ASA CLASS:  Class I     INDICATIONS:  hematochezia hx hemorrhoids, s/p hemorrhoidectomy in     2002     tubular adenoma 2002 and 2007     MEDICATIONS:   Versed 10 mg, Fentanyl 100 mcg           DESCRIPTION OF PROCEDURE:   After the risks benefits and     alternatives of the procedure were thoroughly explained, informed     consent was obtained.  Digital rectal exam was performed and     revealed no rectal masses.   The  endoscope was introduced through     the anus and advanced to the cecum, which was identified by both     the appendix and ileocecal valve, without limitations.  The     quality of the prep was adequate, using MiraLax.  The instrument     was then slowly withdrawn as the colon was fully examined.     <<PROCEDUREIMAGES>>           FINDINGS:  Internal hemorrhoids were found in the rectum. Banding     was performed (see image5, image6, and image7). 5 bands applied to     internal hemorrhoids, deployed 7 bands  Mild diverticulosis was     found in the sigmoid colon (see image2).  This was otherwise a     normal examination of the colon (see image3 and image4).     Retroflexion was not performed.  The scope was then withdrawn from     the patient and the procedure completed.           COMPLICATIONS:  None     ENDOSCOPIC IMPRESSION:     1) Internal hemorrhoids in the rectum     2) Mild diverticulosis in the sigmoid colon     3)  Otherwise normal examination     s/p band ligation x 5     RECOMMENDATIONS:     Anusol HC supp, 1 hs     Vicodin as needed for pain     OV 4-6 weeks     REPEAT EXAM:  In 10 year(s) for.           ______________________________     Hedwig Morton. Juanda Chance, MD           CC:           n.     eSIGNED:   Hedwig Morton. Brodie at 10/05/2010 09:37 AM           Carma Lair, [id number]  Note: An exclamation mark (!) indicates a result that was not dispersed into the flowsheet. Document Creation  Date: 10/05/2010 9:37 AM _______________________________________________________________________  (1) Order result status: Final Collection or observation date-time: 10/05/2010 09:26 Requested date-time:  Receipt date-time:  Reported date-time:  Referring Physician:   Ordering Physician: Lina Sar [phone]) Specimen Source:  Source: Launa Grill Order Number: [id number] Lab site:   Appended Document: Colonoscopy    Clinical Lists Changes  Observations: Added new observation of COLONNXTDUE: 09/22/2020 (10/05/2010 9:48)      Appended Document: Colonoscopy Patient already has appointment set up for 11/20/10.

## 2011-01-22 NOTE — Assessment & Plan Note (Signed)
Summary: follow up hemorrhoidal bancing/sheri   History of Present Illness Visit Type: Follow-up Visit Primary GI MD: Lina Sar MD Primary [place]: Cathey Endow, MD Requesting Decarla Siemen: na Chief Complaint: F/u from hemorrhodial banding and colon. Pt denies any GI complaints History of Present Illness:   This is a 65 year old female who is status post colonoscopy and hemorrhoidal banding. She was found to have diverticulosis. She has done well since the  hemorrhoidal banding having only one episode of rectal bleeding approximately 3 weeks ago. She has completed her anusol HC suppositories and cream. She denies being constipated. Her recall colonoscopy will be due in October 2021.   GI Review of Systems      Denies abdominal pain, acid reflux, belching, bloating, chest pain, dysphagia with liquids, dysphagia with solids, heartburn, loss of appetite, nausea, vomiting, vomiting blood, weight loss, and  weight gain.        Denies anal fissure, black tarry stools, change in bowel habit, constipation, diarrhea, diverticulosis, fecal incontinence, heme positive stool, hemorrhoids, irritable bowel syndrome, jaundice, light color stool, liver problems, rectal bleeding, and  rectal pain.    Current Medications (verified): 1)  Cowden Herbal Tx (Lyme Disease) .... Once Daily 2)  Multivitamins   Tabs (Multiple Vitamin) .... One Tablet By Mouth Once Daily 3)  Calcium Carbonate 600 Mg Tabs (Calcium Carbonate) .... One Tablet By Mouth Twice A Day 4)  Vitamin K 2 .... Take 1 Tablet By Mouth Once A Day 5)  Pa Resveratrol 250 Mg Caps (Resveratrol) .... Take 1 Tablet By Mouth Once Daily 6)  Nac .... Take 1 Tablet By Mouth Once A Day 7)  Anusol-Hc 25 Mg Supp (Hydrocortisone Acetate) .... Insert 1 Suppository Into Rectum At Bedtime As Needed For Rectal Bleeding  Allergies (verified): No Known Drug Allergies  Past History:  Past Medical History: Skin cancer, hx of- squamous cell, in  situ Hyperlipidemia Osteopenia cardiac work up with stress test 08 [facility] Lyme disease07-08 Lyme specialist-- Dr Abner Greenspan Adenomatous Colon Polyps GI Bleed  Internal hemorrhoids in the rectum  Mild diverticulosis in the sigmoid colon  Past Surgical History: Hemorrhoidectomy (04/2001) Hysterectomy- partial, fibroids (03/1996) Removed keloid scar Spinal fusion Rhinoplasty Hemorrhoid Banding  Colonoscopy- polyps, diverticulosis, hemorrhoids (04/2001) Dexa- osteopenia hip (04/2001) [place] Abd- large right kidney cyst (03/2003) Dexa- osteopenia hip, worse (03/2003) Right renal cyst Life line screen- PVD, carotid , heel dexa negative (01/2006) Osteopenia slightly decreased 905/2007) dexa 9/09 LS -.78 (improved) hip - 2.13 (worse)  Family History: Father: deceased- heart problems, CHF Mother: pancreatic cancer, died at 41 of heart disease Siblings:  sister- chol uncle cancer ? uncle with DM Family History of Heart Disease: Father, Maternal Grandfather Family History of Uterine Cancer: Sister No FH of Colon Cancer:  Social History: Reviewed history from 08/11/2008 and no changes required. Marital Status: married Children: 1 daughter Occupation: retired non smoker  no alcohol   Review of Systems  The patient denies allergy/sinus, anemia, anxiety-new, arthritis/joint pain, back pain, blood in urine, breast changes/lumps, change in vision, confusion, cough, coughing up blood, depression-new, fainting, fatigue, fever, headaches-new, hearing problems, heart murmur, heart rhythm changes, itching, menstrual pain, muscle pains/cramps, night sweats, nosebleeds, pregnancy symptoms, shortness of breath, skin rash, sleeping problems, sore throat, swelling of feet/legs, swollen lymph glands, thirst - excessive , urination - excessive , urination changes/pain, urine leakage, vision changes, and voice change.         Pertinent positive and negative review of systems were noted in the above  HPI. All other ROS was otherwise negative.   Vital Signs:  Patient profile:   66 year old female Height:      64 inches Weight:      132 pounds BMI:     22.74 BSA:     1.64 Pulse rate:   60 / minute Pulse rhythm:   regular BP sitting:   100 / 64  (left arm) Cuff size:   regular  Vitals Entered By: Ok Anis CMA (November 20, 2010 4:12 PM)  Physical Exam  General:  Well developed, well nourished, no acute distress. Lungs:  Clear throughout to auscultation. Heart:  Regular rate and rhythm; no murmurs, rubs,  or bruits. Abdomen:  Soft, nontender and nondistended. No masses, hepatosplenomegaly or hernias noted. Normal bowel sounds. Rectal:  anoscopic exam reveals large external hemorrhoidal tags which appeared to be erythematous and painful. Normal anal canal. One first-grade hemorrhoid internally. Her other hemorrhoids have been banded and appear fibrosed. There is no bleeding. Stool is Hemoccult-negative. Extremities:  No clubbing, cyanosis, edema or deformities noted.   Impression & Recommendations:  Problem # 1:  BLOOD IN STOOL (ICD-578.1) Patient is status post hemorrhoidal banding 3 weeks ago. She  remains asymptomatic. She will continue a high-fiber diet and return p.r.n.  Problem # 2:  COLONIC POLYPS, ADENOMATOUS, HX OF (ICD-V12.72) A recall colonoscopy will be due in October 2021.  Patient Instructions: 1)  Please schedule a follow-up appointment as needed.  2)  Copy sent to : Roxy Manns, MD 3)  Recall colonoscopy October 2021. 4)  The medication list was reviewed and reconciled.  All changed / newly prescribed medications were explained.  A complete medication list was provided to the patient / caregiver.

## 2011-01-22 NOTE — Op Note (Signed)
Summary: Internal and External Hemorrhoids                         [facility]  Patient:    Melissa Blackwell, Melissa Blackwell                     MRN: [id number] Proc. Date: 05/21/01 Adm. Date:  [id number] Attending:  Mervin Hack                           Operative Report  PREOPERATIVE DIAGNOSIS:  Internal and external hemorrhoids.  POSTOPERATIVE DIAGNOSIS:  Internal and external hemorrhoids.  OPERATION PERFORMED:  Hemorrhoidectomy.  SURGEON:  Timothy E. Earlene Plater, M.D.  ANESTHESIA:  General.  INDICATIONS FOR PROCEDURE:  [name] has had hemorrhoids for years.  She is now 16 and has some evidence of chronic constipation.  I did insist on colonoscopy which was done today, showing diverticulosis.  Three polyps were removed and melanosis coli.  She is prepped and ready for surgery and this has been carefully discussed in the office.  DESCRIPTION OF PROCEDURE:  The patient was brought to the operating room and placed supine.  LMA anesthesia provided.  She was placed in lithotomy. Perianal area inspected, prepped and draped in the usual fashion.  I used a proctoscope to remove the liquid stool from the rectum which was normal to about 15 cm.  The scope was withdrawn and the area reprepped and draped. Hemorrhoids were present in the anterior, right anterior positions, right posterior and left lateral positions.  The area was injected round and about with Marcaine 0.5% with epinephrine mixed 9 to 1 with Wydase.  This was massaged in well.  With the anoscope in place, the right posterior and left lateral hemorrhoids were removed in a standard fashion with the skin ellipsed removing the hemorrhoidal tissue.  Care taken to avoid the sphincter and the wounds were closed with a running 2-0 chromic.  The anterior and right anterior hemorrhoids were band ligated.  There was no other pathology. The procedure was complete and there was no bleeding or complication.  Gelfoam, gauze and a dry  sterile dressing applied.  The patient tolerated the procedure well and was removed to the recovery room in good condition. DD:  05/21/01 TD:  05/21/01 Job: [id number]

## 2011-01-22 NOTE — Letter (Signed)
Summary: Results Follow up Letter  [place] [facility]  [address] [place], [place] [address]   Phone: [phone]  Fax: [phone]    09/20/2010 MRN: [id number]  [facility] [address] [place], [place]  [address]  Dear Ms. Brandl,  The following are the results of your recent test(s):  Test         Result    Pap Smear:        Normal _____  Not Normal _____ Comments: ______________________________________________________ Cholesterol: LDL(Bad cholesterol):         Your goal is less than:         HDL (Good cholesterol):       Your goal is more than: Comments:  ______________________________________________________ Mammogram:        Normal __X__  Not Normal _____ Comments:Repeat in 1 year  ___________________________________________________________________ Hemoccult:        Normal _____  Not normal _______ Comments:    _____________________________________________________________________ Other Tests:    We routinely do not discuss normal results over the telephone.  If you desire a copy of the results, or you have any questions about this information we can discuss them at your next office visit.   Sincerely,    Sharilyn Sites for Dr. Roxy Manns

## 2011-01-22 NOTE — Letter (Signed)
Summary: [facility]   Imported By: Lanelle Bal 01/10/2010 09:06:40  _____________________________________________________________________  External Attachment:    Type:   Image     Comment:   External Document

## 2011-01-22 NOTE — Progress Notes (Signed)
  Phone Note Call from Patient   Summary of Call: needs refil valtrex- written  Follow-up for Phone Call        printed in put in nurse in box for pickup  Follow-up by: Judith Part MD,  November 13, 2009 10:41 AM    Prescriptions: VALTREX 1 GM TABS (VALACYCLOVIR HCL) 1 by mouth two times a day for one day for cold sore  #2 x 3   Entered and Authorized by:   Judith Part MD   Signed by:   Judith Part MD on 11/13/2009   Method used:   Print then Give to Patient   RxID:   [id number]

## 2011-01-22 NOTE — Assessment & Plan Note (Signed)
Summary: 1ST [facility] CPX  CYD   Vital Signs:  Patient profile:   65 year old female Height:      64 inches Weight:      131 pounds BMI:     22.57 Temp:     98.1 degrees F oral Pulse rate:   68 / minute Pulse rhythm:   regular BP sitting:   108 / 66  (left arm) Cuff size:   regular  Vitals Entered By: Lewanda Rife LPN (August 21, 2010 10:46 AM) CC: check up - partial hyst in past  Vision Screening:Left eye w/o correction: 20 / 20 Right Eye w/o correction: 20 / 20 Both eyes w/o correction:  20/ 20        Vision Entered By: Lewanda Rife LPN (August 21, 2010 10:50 AM)  Hearing Screen 25db HL: Left  500 hz: 25db 1000 hz: 25db 2000 hz: 25db 4000 hz: No Response Right  500 hz: 25db 1000 hz: 25db 2000 hz: 25db 4000 hz: No Response  40db HL: Left  4000 hz: No Response Right  4000 hz: 40db    History of Present Illness: here for check up of chronic med problems and to review health mt list   has been feeling ok overall    wt is stable with bmi of 22  108/66  some probs with hearing screen  has been concerned about hearing  thinks her hyperbaric o2 for lyme - that may have aff hearing  misses things on TV  wants to wait on hearing tests ,however  hx of skin ca saw her Criss Rosales -- last thursday- removed 3 lesions -- all benign  wears sun protection  lipids- up significantly with LDL in 160s (from 130s) good hdl 68 has been off the wagon for healthy diet -- too much steak and hamburgers and some butter and some fried food  in past -- brought down with diet change    osteopenia - decided not to take meds  she is using something called power plate - vibration - frequency -- 3 times per week  ? if approved  also is exercising -- going to gym 3 times per week - treadmill and calesthenics / machine  dexa 9/09 vit D level is good at 47 ca and d-- is good with that   colonosc 07- due 5 y for adenomatous polyp has noticed some blood from rectum on toilet tissue    thinks she has hemorroids  does not happen when she is straining  blood is bright red   hyst in past partial -- pap nl 8/10 no symptoms or problems   mamd 11/10 self exam   Td 04 flu- does not get  pneumovax-- want to skip that  zoster - does not want        Allergies (verified): No Known Drug Allergies  Past History:  Past Medical History: Last updated: 08/11/2008 Skin cancer, hx of- squamous cell, in situ Hyperlipidemia Osteopenia cardiac work up with stress test 08 [facility] Lyme disease07-08   Lyme specialist-- Dr Abner Greenspan  Past Surgical History: Last updated: 10/07/2008 Hemorrhoidectomy (04/2001) Hysterectomy- partial, fibroids (03/1996) Removed keloid scar Colonoscopy- polyps, diverticulosis, hemorrhoids (04/2001) Dexa- osteopenia hip (04/2001) [place] Abd- large right kidney cyst (03/2003) Dexa- osteopenia hip, worse (03/2003) Right renal cyst Life line screen- PVD, carotid , heel dexa negative (01/2006) Osteopenia slightly decreased 905/2007) Spinal fusion dexa 9/09 LS -.78 (improved) hip - 2.13 (worse)  Family History: Last updated: 08-13-07 Father: deceased- heart problems, CHF  Mother: pancreatic cancer, died at 59 of heart disease Siblings:  sister- chol uncle cancer ? uncle with DM  Social History: Last updated: 08/11/2008 Marital Status: married Children: 1 daughter Occupation: retired non smoker  no alcohol   Risk Factors: Smoking Status: never (07/21/2007)  Review of Systems General:  Denies fatigue, loss of appetite, and malaise. Eyes:  Denies blurring and eye irritation. CV:  Denies chest pain or discomfort, lightheadness, palpitations, and shortness of breath with exertion. Resp:  Denies cough, shortness of breath, and wheezing. GI:  Complains of bloody stools, change in bowel habits, and hemorrhoids; denies abdominal pain and indigestion. GU:  Denies hematuria. MS:  Denies joint pain, joint redness, and joint  swelling. Derm:  Denies itching, lesion(s), poor wound healing, and rash. Neuro:  Denies numbness and tingling. Psych:  Denies anxiety and depression. Endo:  Denies cold intolerance, excessive thirst, excessive urination, and heat intolerance. Heme:  Denies abnormal bruising and bleeding.  Physical Exam  General:  Well-developed,well-nourished,in no acute distress; alert,appropriate and cooperative throughout examination Head:  normocephalic, atraumatic, and no abnormalities observed.   Eyes:  vision grossly intact, pupils equal, pupils round, and pupils reactive to light.  no conjunctival pallor, injection or icterus  Ears:  R ear normal and L ear normal.   Nose:  no nasal discharge.   Mouth:  pharynx pink and moist.   Neck:  supple with full rom and no masses or thyromegally, no JVD or carotid bruit  Chest Wall:  No deformities, masses, or tenderness noted. Breasts:  No mass, nodules, thickening, tenderness, bulging, retraction, inflamation, nipple discharge or skin changes noted.   Lungs:  Normal respiratory effort, chest expands symmetrically. Lungs are clear to auscultation, no crackles or wheezes. Heart:  Normal rate and regular rhythm. S1 and S2 normal without gallop, murmur, click, rub or other extra sounds. Abdomen:  Bowel sounds positive,abdomen soft and non-tender without masses, organomegaly or hernias noted. no renal bruits  Msk:  No deformity or scoliosis noted of thoracic or lumbar spine.  no acute joint changes  Pulses:  R and L carotid,radial,femoral,dorsalis pedis and posterior tibial pulses are full and equal bilaterally Extremities:  No clubbing, cyanosis, edema, or deformity noted with normal full range of motion of all joints.   Neurologic:  sensation intact to light touch, gait normal, and DTRs symmetrical and normal.   Skin:  Intact without suspicious lesions or rashes Cervical Nodes:  No lymphadenopathy noted Axillary Nodes:  No palpable lymphadenopathy Inguinal  Nodes:  No significant adenopathy Psych:  normal affect, talkative and pleasant    Impression & Recommendations:  Problem # 1:  BLOOD IN STOOL (ICD-578.1) Assessment New ref to GI ? more than would be expected with hemorroids  Orders: Gastroenterology Referral (GI)  Problem # 2:  OTHER SCREENING MAMMOGRAM (ICD-V76.12) Assessment: Comment Only annual mammogram scheduled adv pt to continue regular self breast exams non remarkable breast exam today  Orders: Radiology Referral (Radiology)  Problem # 3:  OSTEOPENIA (ICD-733.90) Assessment: Unchanged sched dexa  pt wants to avoid med rev D level  disc vibration therapy Her updated medication list for this problem includes:    Calcium 500 Mg Tabs (Calcium) ..... One by mouth qd    Calcium Carbonate 600 Mg Tabs (Calcium carbonate) ..... One tablet by mouth twice a day  Orders: Radiology Referral (Radiology)  Problem # 4:  HYPERLIPIDEMIA (ICD-272.4) Assessment: Deteriorated  this is worse with worse diet rev low sat fat diet  re check 3 mo  Labs Reviewed: SGOT: 23 (08/09/2010)   SGPT: 18 (08/09/2010)   HDL:68.20 (08/09/2010), 73.60 (11/13/2009)  LDL:DEL (08/11/2008), DEL (08/10/2007)  Chol:244 (08/09/2010), 222 (11/13/2009)  Trig:81.0 (08/09/2010), 72.0 (11/13/2009)  Problem # 5:  Preventive Health Care (ICD-V70.0) Assessment: Comment Only pt declines imms today  Complete Medication List: 1)  Calcium 500 Mg Tabs (Calcium) .... One by mouth qd 2)  Cowden Herbal Tx (lyme Disease)  3)  Mvi  .Marland Kitchen.. 1 by mouth qd 4)  Valtrex 1 Gm Tabs (Valacyclovir hcl) .Marland Kitchen.. 1 by mouth two times a day for one day for cold sore as needed 5)  Calcium Carbonate 600 Mg Tabs (Calcium carbonate) .... One tablet by mouth twice a day 6)  Vitamin K 2  .... Take 1 tablet by mouth once a day 7)  Resperatrol 500mg   .... Take 1 tablet by mouth once a day 8)  Nac  .... Take 1 tablet by mouth once a day  Contraindications/Deferment of  Procedures/Staging:    Test/Procedure: FLU VAX    Reason for deferment: patient declined     Test/Procedure: Pneumovax vaccine    Reason for deferment: patient declined     Test/Procedure: Zoster vaccine    Reason for deferment: declined   Patient Instructions: 1)  we will schedule dexa and mammogram at check out  2)  you can raise your HDL (good cholesterol) by increasing exercise and eating omega 3 fatty acid supplement like fish oil or flax seed oil over the counter 3)  you can lower LDL (bad cholesterol) by limiting saturated fats in diet like red meat, fried foods, egg yolks, fatty breakfast meats, high fat dairy products and shellfish 4)  schedule fasting lab in 3 months lipid/ast/alt 5)  we will do GI ref at check out  6)  call back if you want hearing referral   Current Allergies (reviewed today): No known allergies

## 2011-01-22 NOTE — Progress Notes (Signed)
Summary: add lab test  Phone Note Call from Patient Call back at Home Phone [phone]   Caller: Patient Call For: Judith Part MD Summary of Call: pt had labs today, she asked about adding a test to check NK cells, she says her Dr in [place] does this test due to lyme disease and she is to see him next week. Since having blood drawn she wanted to add it. We drew the blood for the test just need the ok to add or not. Thanks  Initial call taken by: Liane Comber CMA Duncan Dull),  November 21, 2010 9:50 AM  Follow-up for Phone Call        you can check with Jamesetta So to be sure- but because of legal issues I do not think I'm allowed to draw labs for another Dr Marland Kitchennon Corinda Gubler) sorry Follow-up by: Judith Part MD,  November 21, 2010 1:13 PM  Additional Follow-up for Phone Call Additional follow up Details #1::        OK patient notified. Additional Follow-up by: Mills Koller,  November 22, 2010 8:10 AM

## 2011-01-22 NOTE — Letter (Signed)
Summary: [facility]   Imported By: Maryln Gottron 07/06/2009 10:59:16  _____________________________________________________________________  External Attachment:    Type:   Image     Comment:   External Document

## 2011-02-27 ENCOUNTER — Other Ambulatory Visit: Payer: Self-pay

## 2011-02-28 ENCOUNTER — Encounter (INDEPENDENT_AMBULATORY_CARE_PROVIDER_SITE_OTHER): Payer: Self-pay | Admitting: *Deleted

## 2011-02-28 ENCOUNTER — Other Ambulatory Visit: Payer: Self-pay | Admitting: Family Medicine

## 2011-02-28 ENCOUNTER — Other Ambulatory Visit (INDEPENDENT_AMBULATORY_CARE_PROVIDER_SITE_OTHER): Payer: Medicare Other

## 2011-02-28 DIAGNOSIS — E785 Hyperlipidemia, unspecified: Secondary | ICD-10-CM

## 2011-02-28 LAB — LIPID PANEL
Cholesterol: 231 mg/dL — ABNORMAL HIGH (ref 0–200)
HDL: 76.1 mg/dL (ref >39.00)
Total CHOL/HDL Ratio: 3
Triglycerides: 76 mg/dL (ref 0.0–149.0)
VLDL: 15.2 mg/dL (ref 0.0–40.0)

## 2011-02-28 LAB — ALT: ALT: 19 U/L (ref 0–35)

## 2011-02-28 LAB — AST: AST: 24 U/L (ref 0–37)

## 2011-02-28 LAB — LDL CHOLESTEROL, DIRECT: Direct LDL: 142.9 mg/dL

## 2011-03-06 ENCOUNTER — Ambulatory Visit (INDEPENDENT_AMBULATORY_CARE_PROVIDER_SITE_OTHER): Payer: Medicare Other | Admitting: Family Medicine

## 2011-03-06 ENCOUNTER — Encounter: Payer: Self-pay | Admitting: Family Medicine

## 2011-03-06 DIAGNOSIS — E785 Hyperlipidemia, unspecified: Secondary | ICD-10-CM

## 2011-03-10 LAB — POCT HEMOGLOBIN-HEMACUE: Hemoglobin: 13 g/dL (ref 12.0–15.0)

## 2011-03-21 NOTE — Assessment & Plan Note (Signed)
Summary: FOLLOW UP AFTER LABS/RI   Vital Signs:  Patient profile:   65 year old female Height:      64 inches Weight:      131 pounds BMI:     22.57 Temp:     98.8 degrees F oral Pulse rate:   80 / minute Pulse rhythm:   regular BP sitting:   96 / 64  (left arm) Cuff size:   regular  Vitals Entered By: Lewanda Rife LPN (March 06, 2011 2:54 PM) CC: follow-up visit after labs   History of Present Illness: here for f/u of hyperlipidemia   has been feeling fair   is back on abx  saw her lyme dz specialist in dec - said her liver was enlarged and she had hyperacitve reflexes put her back on abx -- on zithromax and omnicef and septra together she wants to get back on herbal protocol  f/u planned in april     wt is stable with bmi of 22  96/64 bp today  lipids are very close to last value trig 76 and HDL 76 and LDL 142 in past LDL got as high as the 160s   is good with her diet  oatmeal daily  cheese just when eating out  she does not eat beef very often -- less than once per week  fried chicken on sundays    one egg once per week  a lot of crabmeat   goes to the gym 3 days per week -- treadmill , weights / machines    Allergies (verified): No Known Drug Allergies  Past History:  Past Medical History: Last updated: 11/22/10 Skin cancer, hx of- squamous cell, in situ Hyperlipidemia Osteopenia cardiac work up with stress test 08 [facility] Lyme disease07-08 Lyme specialist-- Dr Abner Greenspan Adenomatous Colon Polyps GI Bleed  Internal hemorrhoids in the rectum  Mild diverticulosis in the sigmoid colon  Past Surgical History: Last updated: Nov 22, 2010 Hemorrhoidectomy (04/2001) Hysterectomy- partial, fibroids (03/1996) Removed keloid scar Spinal fusion Rhinoplasty Hemorrhoid Banding  Colonoscopy- polyps, diverticulosis, hemorrhoids (04/2001) Dexa- osteopenia hip (04/2001) [place] Abd- large right kidney cyst (03/2003) Dexa- osteopenia hip, worse  (03/2003) Right renal cyst Life line screen- PVD, carotid , heel dexa negative (01/2006) Osteopenia slightly decreased 905/2007) dexa 9/09 LS -.78 (improved) hip - 2.13 (worse)  Family History: Last updated: 2010-11-22 Father: deceased- heart problems, CHF Mother: pancreatic cancer, died at 81 of heart disease Siblings:  sister- chol uncle cancer ? uncle with DM Family History of Heart Disease: Father, Maternal Grandfather Family History of Uterine Cancer: Sister No FH of Colon Cancer:  Social History: Last updated: 08/11/2008 Marital Status: married Children: 1 daughter Occupation: retired non smoker  no alcohol   Risk Factors: Smoking Status: never (07/21/2007)  Review of Systems General:  Denies fatigue, loss of appetite, and malaise. Eyes:  Denies blurring and eye irritation. CV:  Denies chest pain or discomfort, palpitations, and shortness of breath with exertion. Resp:  Denies cough, shortness of breath, and wheezing. GI:  Denies abdominal pain, change in bowel habits, indigestion, and nausea. MS:  Denies joint pain and joint swelling. Derm:  Denies itching, lesion(s), and rash. Neuro:  Denies numbness and tingling. Endo:  Denies excessive thirst and excessive urination. Heme:  Denies abnormal bruising and bleeding.  Physical Exam  General:  Well-developed,well-nourished,in no acute distress; alert,appropriate and cooperative throughout examination Head:  normocephalic, atraumatic, and no abnormalities observed.   Eyes:  vision grossly intact, pupils equal, pupils round, and pupils  reactive to light.  no conjunctival pallor, injection or icterus  Neck:  supple with full rom and no masses or thyromegally, no JVD or carotid bruit  Lungs:  Normal respiratory effort, chest expands symmetrically. Lungs are clear to auscultation, no crackles or wheezes. Heart:  Normal rate and regular rhythm. S1 and S2 normal without gallop, murmur, click, rub or other extra  sounds. Abdomen:  Bowel sounds positive,abdomen soft and non-tender without masses, organomegaly or hernias noted. no renal bruits  Msk:  no new joint hcanges  Pulses:  R and L carotid,radial,femoral,dorsalis pedis and posterior tibial pulses are full and equal bilaterally Extremities:  No clubbing, cyanosis, edema, or deformity noted with normal full range of motion of all joints.   Neurologic:  sensation intact to light touch, gait normal, and DTRs symmetrical and normal.   Skin:  Intact without suspicious lesions or rashes Cervical Nodes:  No lymphadenopathy noted Psych:  normal affect, talkative and pleasant    Impression & Recommendations:  Problem # 1:  HYPERLIPIDEMIA (ICD-272.4) Assessment Unchanged  this is essentially unchanged with LDL in 140s and HDL in 70s  will continue to follow rev labs with pt  rev low sat fat diet with pt  plan to cut down on shellfish and fried food re check in fall  disc goals for LDL- do not want this to go higher in terms of CV risk  Labs Reviewed: SGOT: 24 (02/28/2011)   SGPT: 19 (02/28/2011)   HDL:76.10 (02/28/2011), 77.50 (11/21/2010)  LDL:DEL (08/11/2008), DEL (08/10/2007)  Chol:231 (02/28/2011), 244 (11/21/2010)  Trig:76.0 (02/28/2011), 57.0 (11/21/2010)  Problem # 2:  LYME DISEASE (ICD-088.81) Assessment: Comment Only pt currently on 3 abx [place]  hope to come off these in april  Complete Medication List: 1)  [place] Herbal Tx (lyme Disease)  .... Once daily 2)  Multivitamins Tabs (Multiple vitamin) .... One tablet by mouth once daily 3)  Calcium Carbonate 600 Mg Tabs (Calcium carbonate) .... One tablet by mouth twice a day 4)  Vitamin K 2  .... Take 1 tablet by mouth once a day 5)  Pa Resveratrol 250 Mg Caps (Resveratrol) .... Take 2  tablets  by mouth once daily 6)  Nac  .... Take 1 tablet by mouth once a day 7)  Vitamin B12 ?mg (pt Said High Dose)  .... Two injections weekly 8)  Deplin ?mg   .... Take 1 tablet by mouth once a day 9)  Zithromycin ?mg  .... Two daily on mon-wed-fri for 2 weeks then off med 2 weeks. 10)  Omnicef ?mg  .... Two daily on mon-wed-fri for 2 weeks then off med for 2 weeks 11)  Septra?mg  .... Two daily on mon-wed-fri for 2 weeks then off med for 2 weeks 12)  Flagyl ? Mg  .... Two daily on thur and fri of second week.  Patient Instructions: 1)  you can raise your HDL (good cholesterol) by increasing exercise and eating omega 3 fatty acid supplement like fish oil or flax seed oil over the counter 2)  you can lower LDL (bad cholesterol) by limiting saturated fats in diet like red meat, fried foods, egg yolks, fatty breakfast meats, high fat dairy products and shellfish  3)  cut back on the crabmeat and fried food  4)  schedule PE in october with labs prior    Orders Added: 1)  Est. Patient Level III [09811]    Current Allergies (reviewed today): No  known allergies

## 2011-03-31 LAB — POCT HEMOGLOBIN-HEMACUE: Hemoglobin: 12.5 g/dL (ref 12.0–15.0)
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
## 2011-05-10 NOTE — Op Note (Signed)
[facility]  Patient:    Melissa Blackwell, Melissa Blackwell                     MRN: [id number] Proc. Date: 05/21/01 Adm. Date:  [id number] Attending:  Mervin Hack                           Operative Report  PREOPERATIVE DIAGNOSIS:  Internal and external hemorrhoids.  POSTOPERATIVE DIAGNOSIS:  Internal and external hemorrhoids.  OPERATION PERFORMED:  Hemorrhoidectomy.  SURGEON:  Timothy E. Earlene Plater, M.D.  ANESTHESIA:  General.  INDICATIONS FOR PROCEDURE:  [name] has had hemorrhoids for years.  She is now 4 and has some evidence of chronic constipation.  I did insist on colonoscopy which was done today, showing diverticulosis.  Three polyps were removed and melanosis coli.  She is prepped and ready for surgery and this has been carefully discussed in the office.  DESCRIPTION OF PROCEDURE:  The patient was brought to the operating room and placed supine.  LMA anesthesia provided.  She was placed in lithotomy. Perianal area inspected, prepped and draped in the usual fashion.  I used a proctoscope to remove the liquid stool from the rectum which was normal to about 15 cm.  The scope was withdrawn and the area reprepped and draped. Hemorrhoids were present in the anterior, right anterior positions, right posterior and left lateral positions.  The area was injected round and about with Marcaine 0.5% with epinephrine mixed 9 to 1 with Wydase.  This was massaged in well.  With the anoscope in place, the right posterior and left lateral hemorrhoids were removed in a standard fashion with the skin ellipsed removing the hemorrhoidal tissue.  Care taken to avoid the sphincter and the wounds were closed with a running 2-0 chromic.  The anterior and right anterior hemorrhoids were band ligated.  There was no other pathology. The procedure was complete and there was no bleeding or complication.  Gelfoam, gauze and a dry sterile dressing applied.  The patient tolerated  the procedure well and was removed to the recovery room in good condition. DD:  05/21/01 TD:  05/21/01 Job: [id number]

## 2011-07-09 ENCOUNTER — Encounter: Payer: Self-pay | Admitting: Internal Medicine

## 2011-09-26 ENCOUNTER — Other Ambulatory Visit: Payer: Medicare Other

## 2011-10-01 ENCOUNTER — Encounter: Payer: Medicare Other | Admitting: Family Medicine

## 2011-10-05 ENCOUNTER — Telehealth: Payer: Self-pay | Admitting: Family Medicine

## 2011-10-05 DIAGNOSIS — M899 Disorder of bone, unspecified: Secondary | ICD-10-CM

## 2011-10-05 DIAGNOSIS — Z86010 Personal history of colonic polyps: Secondary | ICD-10-CM

## 2011-10-05 DIAGNOSIS — E785 Hyperlipidemia, unspecified: Secondary | ICD-10-CM

## 2011-10-05 NOTE — Telephone Encounter (Signed)
Message copied by Judy Pimple on Sat Oct 05, 2011 10:15 AM ------      Message from: Baldomero Lamy      Created: Thu Oct 03, 2011 11:32 AM      Regarding: Cpx labs Thurs 10/18       Please order  future cpx labs for pt's upcomming lab appt.      Thanks      Rodney Booze

## 2011-10-10 ENCOUNTER — Other Ambulatory Visit (INDEPENDENT_AMBULATORY_CARE_PROVIDER_SITE_OTHER): Payer: Medicare Other

## 2011-10-10 DIAGNOSIS — M899 Disorder of bone, unspecified: Secondary | ICD-10-CM

## 2011-10-10 DIAGNOSIS — E785 Hyperlipidemia, unspecified: Secondary | ICD-10-CM

## 2011-10-10 DIAGNOSIS — Z86010 Personal history of colonic polyps: Secondary | ICD-10-CM

## 2011-10-10 LAB — TSH: TSH: 1.56 u[IU]/mL (ref 0.35–5.50)

## 2011-10-10 LAB — LIPID PANEL
Cholesterol: 217 mg/dL — ABNORMAL HIGH (ref 0–200)
HDL: 77.2 mg/dL (ref >39.00)
Total CHOL/HDL Ratio: 3
Triglycerides: 55 mg/dL (ref 0.0–149.0)
VLDL: 11 mg/dL (ref 0.0–40.0)

## 2011-10-10 LAB — COMPREHENSIVE METABOLIC PANEL
ALT: 19 U/L (ref 0–35)
AST: 26 U/L (ref 0–37)
Albumin: 4.6 g/dL (ref 3.5–5.2)
Alkaline Phosphatase: 75 U/L (ref 39–117)
BUN: 16 mg/dL (ref 6–23)
CO2: 30 mEq/L (ref 19–32)
Calcium: 9.7 mg/dL (ref 8.4–10.5)
Chloride: 106 mEq/L (ref 96–112)
Creatinine, Ser: 0.8 mg/dL (ref 0.4–1.2)
GFR: 75.05 mL/min (ref >60.00)
Glucose, Bld: 101 mg/dL — ABNORMAL HIGH (ref 70–99)
Potassium: 4.8 mEq/L (ref 3.5–5.1)
Sodium: 143 mEq/L (ref 135–145)
Total Bilirubin: 0.8 mg/dL (ref 0.3–1.2)
Total Protein: 7.5 g/dL (ref 6.0–8.3)

## 2011-10-10 LAB — CBC WITH DIFFERENTIAL/PLATELET
Basophils Absolute: 0 10*3/uL (ref 0.0–0.1)
Basophils Relative: 0.7 % (ref 0.0–3.0)
Eosinophils Absolute: 0 10*3/uL (ref 0.0–0.7)
Eosinophils Relative: 1 % (ref 0.0–5.0)
HCT: 40.4 % (ref 36.0–46.0)
Hemoglobin: 13.5 g/dL (ref 12.0–15.0)
Lymphocytes Relative: 30.1 % (ref 12.0–46.0)
Lymphs Abs: 1 10*3/uL (ref 0.7–4.0)
MCHC: 33.5 g/dL (ref 30.0–36.0)
MCV: 87.6 fl (ref 78.0–100.0)
Monocytes Absolute: 0.3 10*3/uL (ref 0.1–1.0)
Monocytes Relative: 8.8 % (ref 3.0–12.0)
Neutro Abs: 2 10*3/uL (ref 1.4–7.7)
Neutrophils Relative %: 59.4 % (ref 43.0–77.0)
Platelets: 167 10*3/uL (ref 150.0–400.0)
RBC: 4.61 Mil/uL (ref 3.87–5.11)
RDW: 14.4 % (ref 11.5–14.6)
WBC: 3.4 10*3/uL — ABNORMAL LOW (ref 4.5–10.5)

## 2011-10-10 LAB — VITAMIN D 25 HYDROXY (VIT D DEFICIENCY, FRACTURES): Vit D, 25-Hydroxy: 53 ng/mL (ref 30–89)

## 2011-10-10 LAB — LDL CHOLESTEROL, DIRECT: Direct LDL: 129 mg/dL

## 2011-10-14 ENCOUNTER — Encounter: Payer: Self-pay | Admitting: Family Medicine

## 2011-10-15 ENCOUNTER — Encounter: Payer: Self-pay | Admitting: Family Medicine

## 2011-10-15 ENCOUNTER — Ambulatory Visit (INDEPENDENT_AMBULATORY_CARE_PROVIDER_SITE_OTHER): Payer: Medicare Other | Admitting: Family Medicine

## 2011-10-15 VITALS — BP 98/60 | HR 72 | Temp 98.0°F | Ht 63.5 in | Wt 135.0 lb

## 2011-10-15 DIAGNOSIS — M899 Disorder of bone, unspecified: Secondary | ICD-10-CM

## 2011-10-15 DIAGNOSIS — E785 Hyperlipidemia, unspecified: Secondary | ICD-10-CM

## 2011-10-15 DIAGNOSIS — Z86010 Personal history of colonic polyps: Secondary | ICD-10-CM

## 2011-10-15 DIAGNOSIS — Z1231 Encounter for screening mammogram for malignant neoplasm of breast: Secondary | ICD-10-CM | POA: Insufficient documentation

## 2011-10-15 DIAGNOSIS — A6920 Lyme disease, unspecified: Secondary | ICD-10-CM

## 2011-10-15 DIAGNOSIS — M949 Disorder of cartilage, unspecified: Secondary | ICD-10-CM

## 2011-10-15 NOTE — Assessment & Plan Note (Signed)
Rev last year's dexa Due for another in a year Rev ca and vit D- and good exercise Rev safety with fx risk

## 2011-10-15 NOTE — Assessment & Plan Note (Signed)
Improvement with fish oil and red yeast rice (no side eff) and good diet Will continue to follow Disc goals for HDL and LDL  Disc low sat fat diet - reviewed

## 2011-10-15 NOTE — Assessment & Plan Note (Signed)
Pt has specialist from [facility] following with herbal protocol- doing much better now  Will continue to follow and stay active

## 2011-10-15 NOTE — Assessment & Plan Note (Signed)
Scheduled annual screening mammogram Nl breast exam today  Encouraged monthly self exams   

## 2011-10-15 NOTE — Patient Instructions (Signed)
We will schedule you for a mammogram at check out  Keep eating a healthy diet Avoid red meat/ fried foods/ egg yolks/ fatty breakfast meats/ butter, cheese and high fat dairy/ and shellfish   Continue your calcium and vitamin D and exercise

## 2011-10-15 NOTE — Progress Notes (Signed)
Subjective:    Patient ID: ZEYA BALLES, female    DOB: 14-Oct-1946, 65 y.o.   MRN: [id number]  HPI Here for annual check up of chronic medical problems and to review health mt list  Is doing better  Back on herbal regime for lyme- and doing well with that   Will be seeing urologist -- for stream problems- stops and starts -- will get it checked out  Will be doing a urodynamic study Had this before ever starting the herbal meds    bp is 98/60 Wt is up 4 lb with good bmi of 23  Lipids are improved with LDL of 129 Lab Results  Component Value Date   CHOL 217* 10/10/2011   CHOL 231* 02/28/2011   CHOL 244* 11/21/2010   Lab Results  Component Value Date   HDL 77.20 10/10/2011   HDL 76.10 02/28/2011   HDL 77.50 11/21/2010   No results found for this basename: LDLCALC   Lab Results  Component Value Date   TRIG 55.0 10/10/2011   TRIG 76.0 02/28/2011   TRIG 57.0 11/21/2010   Lab Results  Component Value Date   CHOLHDL 3 10/10/2011   CHOLHDL 3 02/28/2011   CHOLHDL 3 11/21/2010   Lab Results  Component Value Date   LDLDIRECT 129.0 10/10/2011   LDLDIRECT 142.9 02/28/2011   LDLDIRECT 142.3 11/21/2010   diet- tries to eat as healthy as she can - does eat some pizza  Eats one chicken wing once a week / occ red meat   Osteopenia  Ca and D-- is very good with that  Also exercising - 3 days per week , and stays active  Last dexa was 9/11 with osteopenia   colonosc 10/11-- no polyps that time -- told her 42 y f/u is fine  Hx of polyps in distant past  Zoster status- cannot get due to fact it is a live vaccine  Pneumovax- does not want to get due to mercury Flu shot- does not want to get due to mercury  Td 04  Mam 9/11- wants to get that set up at the breast center  Self exam -no lumps or problems   Partial hyst in past Pap nl 8/10-- no gyn problems at all   Patient Active Problem List  Diagnoses  . LYME DISEASE  . HYPERLIPIDEMIA  . HEMORRHOIDS  . DIVERTICULOSIS, COLON    . CONSTIPATION, CHRONIC  . UNSPEC SYMPTOM ASSOC W/FEMALE GENITAL ORGANS  . OSTEOPENIA  . RHEUMATOID FACTOR, POSITIVE  . SKIN CANCER, HX OF  . COLONIC POLYPS, ADENOMATOUS, HX OF  . Other screening mammogram   Past Medical History  Diagnosis Date  . Cancer     skin CA Hx of squamous cell in situ  . Hyperlipidemia   . Osteopenia   . Lyme disease 06/2007    Dr Abner Greenspan  . Hx of adenomatous colonic polyps   . GI bleed   . Internal hemorrhoids   . Diverticulosis of sigmoid colon     mild  . Renal cyst     right renal cyst   Past Surgical History  Procedure Date  . Hemorroidectomy 04/2001  . Abdominal hysterectomy 03/1996    partial fibroids  . Scar removal     keloid  scar  . Spine surgery     spinal fusion  . Rhinoplasty   . Hemorrhoid banding    History  Substance Use Topics  . Smoking status: Never Smoker   . Smokeless tobacco: Not  on file  . Alcohol Use: No   Family History  Problem Relation Age of Onset  . Cancer Mother     pancreatic CA  . Heart disease Mother   . Heart disease Father     CHF and heart problems  . Hyperlipidemia Sister   . Cancer Sister     uterine CA  . Heart disease Maternal Grandmother    No Known Allergies Current Outpatient Prescriptions on File Prior to Visit  Medication Sig Dispense Refill  . calcium carbonate (OS-CAL) 600 MG TABS Take 600 mg by mouth 2 (two) times daily with a meal.        . Multiple Vitamin (MULTIVITAMIN) tablet Take 1 tablet by mouth daily.        Marland Kitchen Resveratrol 250 MG CAPS Take 1 capsule by mouth daily.       Marland Kitchen L-Methylfolate (DEPLIN PO) Take 1 tablet by mouth daily.        . MetroNIDAZOLE (FLAGYL PO) Take by mouth. Take two daily on Thursday and Friday of second week.       Marland Kitchen VITAMIN K PO Take 1 tablet by mouth daily.              Review of Systems Review of Systems  Constitutional: Negative for fever, appetite change, fatigue and unexpected weight change.  Eyes: Negative for pain and visual  disturbance.  Respiratory: Negative for cough and shortness of breath.   Cardiovascular: Negative for cp or palpitations    Gastrointestinal: Negative for nausea, diarrhea and constipation.  Genitourinary: Negative for urgency and frequency.  Skin: Negative for pallor or rash   Neurological: Negative for weakness, light-headedness, numbness and headaches.  Hematological: Negative for adenopathy. Does not bruise/bleed easily.  Psychiatric/Behavioral: Negative for dysphoric mood. The patient is not nervous/anxious.          Objective:   Physical Exam  Constitutional: She appears well-developed and well-nourished.  HENT:  Head: Normocephalic and atraumatic.  Right Ear: External ear normal.  Left Ear: External ear normal.  Nose: Nose normal.  Mouth/Throat: Oropharynx is clear and moist.  Eyes: Conjunctivae and EOM are normal. Pupils are equal, round, and reactive to light.  Neck: Normal range of motion. Neck supple. No JVD present. Carotid bruit is not present. No thyromegaly present.  Cardiovascular: Normal rate, regular rhythm, normal heart sounds and intact distal pulses.   Pulmonary/Chest: Effort normal and breath sounds normal. No respiratory distress. She has no wheezes.  Abdominal: Soft. Bowel sounds are normal. She exhibits no distension, no abdominal bruit and no mass. There is no tenderness.  Genitourinary: No breast swelling, tenderness, discharge or bleeding.  Musculoskeletal: Normal range of motion. She exhibits no edema and no tenderness.       No acute joint changes   Lymphadenopathy:    She has no cervical adenopathy.  Neurological: She is alert. She has normal reflexes. No cranial nerve deficit. Coordination normal.  Skin: Skin is warm and dry. No rash noted. No erythema. No pallor.  Psychiatric: She has a normal mood and affect.          Assessment & Plan:

## 2011-10-15 NOTE — Assessment & Plan Note (Signed)
Last colonoscopy had no polyps -- per pt 10 year call back  Was in 10/11 Doing well without stool changes

## 2011-10-29 ENCOUNTER — Ambulatory Visit: Payer: Medicare Other

## 2011-11-06 ENCOUNTER — Ambulatory Visit
Admission: RE | Admit: 2011-11-06 | Discharge: 2011-11-06 | Disposition: A | Discharge status: Home / Self Care | Payer: Medicare Other | Source: Ambulatory Visit | Attending: Family Medicine | Admitting: Family Medicine

## 2011-11-06 DIAGNOSIS — Z1231 Encounter for screening mammogram for malignant neoplasm of breast: Secondary | ICD-10-CM

## 2012-01-02 DIAGNOSIS — R279 Unspecified lack of coordination: Secondary | ICD-10-CM | POA: Diagnosis not present

## 2012-01-02 DIAGNOSIS — R350 Frequency of micturition: Secondary | ICD-10-CM | POA: Diagnosis not present

## 2012-01-06 DIAGNOSIS — Z85828 Personal history of other malignant neoplasm of skin: Secondary | ICD-10-CM | POA: Diagnosis not present

## 2012-01-06 DIAGNOSIS — L821 Other seborrheic keratosis: Secondary | ICD-10-CM | POA: Diagnosis not present

## 2012-01-06 DIAGNOSIS — D1801 Hemangioma of skin and subcutaneous tissue: Secondary | ICD-10-CM | POA: Diagnosis not present

## 2012-01-06 DIAGNOSIS — D485 Neoplasm of uncertain behavior of skin: Secondary | ICD-10-CM | POA: Diagnosis not present

## 2012-01-06 DIAGNOSIS — B0089 Other herpesviral infection: Secondary | ICD-10-CM | POA: Diagnosis not present

## 2012-01-06 DIAGNOSIS — L905 Scar conditions and fibrosis of skin: Secondary | ICD-10-CM | POA: Diagnosis not present

## 2012-01-06 DIAGNOSIS — L820 Inflamed seborrheic keratosis: Secondary | ICD-10-CM | POA: Diagnosis not present

## 2012-01-29 DIAGNOSIS — R350 Frequency of micturition: Secondary | ICD-10-CM | POA: Diagnosis not present

## 2012-01-29 DIAGNOSIS — M62838 Other muscle spasm: Secondary | ICD-10-CM | POA: Diagnosis not present

## 2012-01-29 DIAGNOSIS — M6281 Muscle weakness (generalized): Secondary | ICD-10-CM | POA: Diagnosis not present

## 2012-01-29 DIAGNOSIS — R279 Unspecified lack of coordination: Secondary | ICD-10-CM | POA: Diagnosis not present

## 2012-04-01 DIAGNOSIS — R351 Nocturia: Secondary | ICD-10-CM | POA: Diagnosis not present

## 2012-04-01 DIAGNOSIS — R350 Frequency of micturition: Secondary | ICD-10-CM | POA: Diagnosis not present

## 2012-04-01 DIAGNOSIS — R39198 Other difficulties with micturition: Secondary | ICD-10-CM | POA: Diagnosis not present

## 2012-05-20 DIAGNOSIS — R5381 Other malaise: Secondary | ICD-10-CM | POA: Diagnosis not present

## 2012-05-20 DIAGNOSIS — R5383 Other fatigue: Secondary | ICD-10-CM | POA: Diagnosis not present

## 2012-05-20 DIAGNOSIS — A6920 Lyme disease, unspecified: Secondary | ICD-10-CM | POA: Diagnosis not present

## 2012-08-12 DIAGNOSIS — H2510 Age-related nuclear cataract, unspecified eye: Secondary | ICD-10-CM | POA: Diagnosis not present

## 2012-08-12 DIAGNOSIS — H40019 Open angle with borderline findings, low risk, unspecified eye: Secondary | ICD-10-CM | POA: Diagnosis not present

## 2012-09-25 DIAGNOSIS — H2510 Age-related nuclear cataract, unspecified eye: Secondary | ICD-10-CM | POA: Diagnosis not present

## 2012-09-25 DIAGNOSIS — H40019 Open angle with borderline findings, low risk, unspecified eye: Secondary | ICD-10-CM | POA: Diagnosis not present

## 2012-10-06 ENCOUNTER — Telehealth: Payer: Self-pay | Admitting: Family Medicine

## 2012-10-06 DIAGNOSIS — E785 Hyperlipidemia, unspecified: Secondary | ICD-10-CM | POA: Insufficient documentation

## 2012-10-06 DIAGNOSIS — M899 Disorder of bone, unspecified: Secondary | ICD-10-CM

## 2012-10-06 DIAGNOSIS — M949 Disorder of cartilage, unspecified: Secondary | ICD-10-CM

## 2012-10-06 DIAGNOSIS — K5909 Other constipation: Secondary | ICD-10-CM

## 2012-10-06 DIAGNOSIS — R799 Abnormal finding of blood chemistry, unspecified: Secondary | ICD-10-CM

## 2012-10-06 DIAGNOSIS — Z86010 Personal history of colonic polyps: Secondary | ICD-10-CM

## 2012-10-06 NOTE — Telephone Encounter (Signed)
Message copied by Judy Pimple on Tue Oct 06, 2012  9:20 PM ------      Message from: Alvina Chou      Created: Thu Oct 01, 2012 11:23 AM      Regarding: Lab orders for Wednesday 10.15.13       Patient is scheduled for CPX labs, please order future labs, Thanks , Camelia Eng

## 2012-10-07 ENCOUNTER — Other Ambulatory Visit (INDEPENDENT_AMBULATORY_CARE_PROVIDER_SITE_OTHER): Payer: Medicare Other

## 2012-10-07 DIAGNOSIS — K5909 Other constipation: Secondary | ICD-10-CM | POA: Diagnosis not present

## 2012-10-07 DIAGNOSIS — Z86010 Personal history of colonic polyps: Secondary | ICD-10-CM | POA: Diagnosis not present

## 2012-10-07 DIAGNOSIS — M899 Disorder of bone, unspecified: Secondary | ICD-10-CM | POA: Diagnosis not present

## 2012-10-07 DIAGNOSIS — M949 Disorder of cartilage, unspecified: Secondary | ICD-10-CM

## 2012-10-07 DIAGNOSIS — E785 Hyperlipidemia, unspecified: Secondary | ICD-10-CM

## 2012-10-07 LAB — COMPREHENSIVE METABOLIC PANEL
ALT: 19 U/L (ref 0–35)
AST: 23 U/L (ref 0–37)
Albumin: 4.1 g/dL (ref 3.5–5.2)
Alkaline Phosphatase: 78 U/L (ref 39–117)
BUN: 16 mg/dL (ref 6–23)
CO2: 28 mEq/L (ref 19–32)
Calcium: 9.5 mg/dL (ref 8.4–10.5)
Chloride: 107 mEq/L (ref 96–112)
Creatinine, Ser: 0.7 mg/dL (ref 0.4–1.2)
GFR: 87.11 mL/min (ref >60.00)
Glucose, Bld: 91 mg/dL (ref 70–99)
Potassium: 4.4 mEq/L (ref 3.5–5.1)
Sodium: 142 mEq/L (ref 135–145)
Total Bilirubin: 0.7 mg/dL (ref 0.3–1.2)
Total Protein: 7.2 g/dL (ref 6.0–8.3)

## 2012-10-07 LAB — LIPID PANEL
Cholesterol: 230 mg/dL — ABNORMAL HIGH (ref 0–200)
HDL: 72.3 mg/dL (ref >39.00)
Total CHOL/HDL Ratio: 3
Triglycerides: 64 mg/dL (ref 0.0–149.0)
VLDL: 12.8 mg/dL (ref 0.0–40.0)

## 2012-10-07 LAB — CBC WITH DIFFERENTIAL/PLATELET
Basophils Absolute: 0 10*3/uL (ref 0.0–0.1)
Basophils Relative: 0.5 % (ref 0.0–3.0)
Eosinophils Absolute: 0 10*3/uL (ref 0.0–0.7)
Eosinophils Relative: 1.2 % (ref 0.0–5.0)
HCT: 40.1 % (ref 36.0–46.0)
Hemoglobin: 13.1 g/dL (ref 12.0–15.0)
Lymphocytes Relative: 31.1 % (ref 12.0–46.0)
Lymphs Abs: 1.2 10*3/uL (ref 0.7–4.0)
MCHC: 32.6 g/dL (ref 30.0–36.0)
MCV: 88.5 fl (ref 78.0–100.0)
Monocytes Absolute: 0.4 10*3/uL (ref 0.1–1.0)
Monocytes Relative: 9.1 % (ref 3.0–12.0)
Neutro Abs: 2.3 10*3/uL (ref 1.4–7.7)
Neutrophils Relative %: 58.1 % (ref 43.0–77.0)
Platelets: 184 10*3/uL (ref 150.0–400.0)
RBC: 4.54 Mil/uL (ref 3.87–5.11)
RDW: 13.6 % (ref 11.5–14.6)
WBC: 4 10*3/uL — ABNORMAL LOW (ref 4.5–10.5)

## 2012-10-07 LAB — TSH: TSH: 2.08 u[IU]/mL (ref 0.35–5.50)

## 2012-10-07 LAB — LDL CHOLESTEROL, DIRECT: Direct LDL: 148.5 mg/dL

## 2012-10-08 LAB — VITAMIN D 25 HYDROXY (VIT D DEFICIENCY, FRACTURES): Vit D, 25-Hydroxy: 44 ng/mL (ref 30–89)

## 2012-10-14 ENCOUNTER — Other Ambulatory Visit: Payer: Medicare Other

## 2012-10-21 ENCOUNTER — Ambulatory Visit (INDEPENDENT_AMBULATORY_CARE_PROVIDER_SITE_OTHER): Payer: Medicare Other | Admitting: Family Medicine

## 2012-10-21 ENCOUNTER — Encounter: Payer: Self-pay | Admitting: Family Medicine

## 2012-10-21 VITALS — BP 124/58 | HR 82 | Temp 98.0°F | Ht 63.5 in | Wt 137.2 lb

## 2012-10-21 DIAGNOSIS — M899 Disorder of bone, unspecified: Secondary | ICD-10-CM | POA: Diagnosis not present

## 2012-10-21 DIAGNOSIS — M949 Disorder of cartilage, unspecified: Secondary | ICD-10-CM

## 2012-10-21 DIAGNOSIS — E785 Hyperlipidemia, unspecified: Secondary | ICD-10-CM | POA: Diagnosis not present

## 2012-10-21 DIAGNOSIS — Z1231 Encounter for screening mammogram for malignant neoplasm of breast: Secondary | ICD-10-CM | POA: Diagnosis not present

## 2012-10-21 NOTE — Patient Instructions (Addendum)
Avoid red meat/ fried foods/ egg yolks/ fatty breakfast meats/ butter, cheese and high fat dairy/ and shellfish   If it does not give you side effects - the red yeast rice did seem to help your cholesterol Please send for last dexa report  We will schedule mammogram at check out

## 2012-10-21 NOTE — Progress Notes (Signed)
Subjective:    Patient ID: Melissa Blackwell, female    DOB: [birth year], 66 y.o.   MRN: [id number]  HPI Here for check up of chronic medical conditions and to review health mt list   Is doing fairly well  Is cutting back gradually on her herbal protocol for lyme , is a slow process  Was on herbal protocol for 18 mo  occ a little numbness or night sweat- but not too bad   Wt is up 2 lb with bmi of 23  Lipid Lab Results  Component Value Date   CHOL 230* 10/07/2012   CHOL 217* 10/10/2011   CHOL 231* 02/28/2011   Lab Results  Component Value Date   HDL 72.30 10/07/2012   HDL 77.20 10/10/2011   HDL 76.10 02/28/2011   No results found for this basename: LDLCALC   Lab Results  Component Value Date   TRIG 64.0 10/07/2012   TRIG 55.0 10/10/2011   TRIG 76.0 02/28/2011   Lab Results  Component Value Date   CHOLHDL 3 10/07/2012   CHOLHDL 3 10/10/2011   CHOLHDL 3 02/28/2011   Lab Results  Component Value Date   LDLDIRECT 148.5 10/07/2012   LDLDIRECT 129.0 10/10/2011   LDLDIRECT 142.9 02/28/2011   has not been taking her red yeast rice  Did not give her side effects  Diet is fairly good - no fried or fast food  Also avoids sugar -makes her feel bad   Vaccines- declines flu / zoster / pneumovax -will not disc why  Td 4/04- will wait for next year to get that   mammo 11/12- needs to set that up  Self exam  colonosc 10/11 Had plyps in past   Had hyst   dexa 9/09 osteopenia   Patient Active Problem List  Diagnosis  . LYME DISEASE  . HYPERLIPIDEMIA  . HEMORRHOIDS  . DIVERTICULOSIS, COLON  . CONSTIPATION, CHRONIC  . UNSPEC SYMPTOM ASSOC W/FEMALE GENITAL ORGANS  . OSTEOPENIA  . RHEUMATOID FACTOR, POSITIVE  . SKIN CANCER, HX OF  . COLONIC POLYPS, ADENOMATOUS, HX OF  . Other screening mammogram  . Hyperlipidemia   Past Medical History  Diagnosis Date  . Cancer     skin CA Hx of squamous cell in situ  . Hyperlipidemia   . Osteopenia   . Lyme disease 06/2007    Dr  Abner Greenspan  . Hx of adenomatous colonic polyps   . GI bleed   . Internal hemorrhoids   . Diverticulosis of sigmoid colon     mild  . Renal cyst     right renal cyst   Past Surgical History  Procedure Date  . Hemorroidectomy 04/2001  . Abdominal hysterectomy 03/1996    partial fibroids  . Scar removal     keloid  scar  . Spine surgery     spinal fusion  . Rhinoplasty   . Hemorrhoid banding    History  Substance Use Topics  . Smoking status: Never Smoker   . Smokeless tobacco: Not on file  . Alcohol Use: No   Family History  Problem Relation Age of Onset  . Cancer Mother     pancreatic CA  . Heart disease Mother   . Heart disease Father     CHF and heart problems  . Hyperlipidemia Sister   . Cancer Sister     uterine CA  . Heart disease Maternal Grandmother    No Known Allergies Current Outpatient Prescriptions on File Prior to Visit  Medication Sig Dispense Refill  . calcium carbonate (OS-CAL) 600 MG TABS Take 600 mg by mouth 2 (two) times daily with a meal.        . KRILL OIL PO Take 1 capsule by mouth daily.        . Multiple Vitamin (MULTIVITAMIN) tablet Take 1 tablet by mouth daily.        . NON FORMULARY Cowden herbal protocol for lyme disease as directed       . Probiotic Product (PROBIOTIC PO) Take 1 capsule by mouth daily.        . Red Yeast Rice Extract 600 MG CAPS Take 1 capsule by mouth 2 (two) times daily.        Marland Kitchen Resveratrol 250 MG CAPS Take 1 capsule by mouth daily.            Review of Systems Review of Systems  Constitutional: Negative for fever, appetite change, fatigue and unexpected weight change.  Eyes: Negative for pain and visual disturbance.  Respiratory: Negative for cough and shortness of breath.   Cardiovascular: Negative for cp or palpitations    Gastrointestinal: Negative for nausea, diarrhea and constipation.  Genitourinary: Negative for urgency and frequency.  Skin: Negative for pallor or rash   Neurological: Negative for  weakness, light-headedness, numbness and headaches.  Hematological: Negative for adenopathy. Does not bruise/bleed easily.  Psychiatric/Behavioral: Negative for dysphoric mood. The patient is not nervous/anxious.         Objective:   Physical Exam  Constitutional: She appears well-developed and well-nourished. No distress.  HENT:  Head: Normocephalic and atraumatic.  Right Ear: External ear normal.  Left Ear: External ear normal.  Nose: Nose normal.  Mouth/Throat: Oropharynx is clear and moist.  Eyes: Conjunctivae normal and EOM are normal. Pupils are equal, round, and reactive to light. Right eye exhibits no discharge. Left eye exhibits no discharge. No scleral icterus.  Neck: Normal range of motion. Neck supple. No JVD present. Carotid bruit is not present. No thyromegaly present.  Cardiovascular: Normal rate, regular rhythm, normal heart sounds and intact distal pulses.  Exam reveals no gallop.   Pulmonary/Chest: Effort normal and breath sounds normal. No respiratory distress. She has no wheezes.  Abdominal: Soft. Bowel sounds are normal. She exhibits no distension, no abdominal bruit and no mass. There is no tenderness.  Genitourinary: No breast swelling, tenderness, discharge or bleeding.       Breast exam: No mass, nodules, thickening, tenderness, bulging, retraction, inflamation, nipple discharge or skin changes noted.  No axillary or clavicular LA.  Chaperoned exam.    Musculoskeletal: Normal range of motion. She exhibits no edema and no tenderness.  Lymphadenopathy:    She has no cervical adenopathy.  Neurological: She is alert. She has normal reflexes. No cranial nerve deficit. She exhibits normal muscle tone. Coordination normal.  Skin: Skin is warm and dry. No rash noted. No erythema. No pallor.  Psychiatric: She has a normal mood and affect.          Assessment & Plan:

## 2012-10-23 NOTE — Assessment & Plan Note (Signed)
Scheduled annual screening mammogram Nl breast exam today  Encouraged monthly self exams   

## 2012-10-23 NOTE — Assessment & Plan Note (Signed)
Schedule dexa Rev ca and D No fx or falls

## 2012-10-23 NOTE — Assessment & Plan Note (Signed)
Lipids are up a bit off red yeast rice  She will start back on it  Disc goals for lipids and reasons to control them Rev labs with pt Rev low sat fat diet in detail

## 2012-11-25 DIAGNOSIS — R5381 Other malaise: Secondary | ICD-10-CM | POA: Diagnosis not present

## 2012-11-25 DIAGNOSIS — R5383 Other fatigue: Secondary | ICD-10-CM | POA: Diagnosis not present

## 2012-11-25 DIAGNOSIS — A6920 Lyme disease, unspecified: Secondary | ICD-10-CM | POA: Diagnosis not present

## 2012-11-25 DIAGNOSIS — Z5181 Encounter for therapeutic drug level monitoring: Secondary | ICD-10-CM | POA: Diagnosis not present

## 2012-12-02 ENCOUNTER — Ambulatory Visit
Admission: RE | Admit: 2012-12-02 | Discharge: 2012-12-02 | Disposition: A | Discharge status: Home / Self Care | Payer: Medicare Other | Source: Ambulatory Visit | Attending: Family Medicine | Admitting: Family Medicine

## 2012-12-02 DIAGNOSIS — Z1231 Encounter for screening mammogram for malignant neoplasm of breast: Secondary | ICD-10-CM

## 2012-12-02 DIAGNOSIS — M949 Disorder of cartilage, unspecified: Secondary | ICD-10-CM | POA: Diagnosis not present

## 2012-12-02 DIAGNOSIS — M899 Disorder of bone, unspecified: Secondary | ICD-10-CM

## 2012-12-02 LAB — HM DEXA SCAN

## 2012-12-03 ENCOUNTER — Encounter: Payer: Self-pay | Admitting: *Deleted

## 2012-12-25 ENCOUNTER — Encounter: Payer: Self-pay | Admitting: Family Medicine

## 2012-12-25 ENCOUNTER — Encounter: Payer: Self-pay | Admitting: *Deleted

## 2013-01-05 DIAGNOSIS — Z85828 Personal history of other malignant neoplasm of skin: Secondary | ICD-10-CM | POA: Diagnosis not present

## 2013-01-05 DIAGNOSIS — B0089 Other herpesviral infection: Secondary | ICD-10-CM | POA: Diagnosis not present

## 2013-01-05 DIAGNOSIS — L820 Inflamed seborrheic keratosis: Secondary | ICD-10-CM | POA: Diagnosis not present

## 2013-01-05 DIAGNOSIS — L821 Other seborrheic keratosis: Secondary | ICD-10-CM | POA: Diagnosis not present

## 2013-01-05 DIAGNOSIS — D1801 Hemangioma of skin and subcutaneous tissue: Secondary | ICD-10-CM | POA: Diagnosis not present

## 2013-01-05 DIAGNOSIS — L905 Scar conditions and fibrosis of skin: Secondary | ICD-10-CM | POA: Diagnosis not present

## 2013-03-31 DIAGNOSIS — R39198 Other difficulties with micturition: Secondary | ICD-10-CM | POA: Diagnosis not present

## 2013-03-31 DIAGNOSIS — N281 Cyst of kidney, acquired: Secondary | ICD-10-CM | POA: Diagnosis not present

## 2013-04-07 DIAGNOSIS — H40019 Open angle with borderline findings, low risk, unspecified eye: Secondary | ICD-10-CM | POA: Diagnosis not present

## 2013-05-24 DIAGNOSIS — H6120 Impacted cerumen, unspecified ear: Secondary | ICD-10-CM | POA: Diagnosis not present

## 2013-07-06 DIAGNOSIS — R5381 Other malaise: Secondary | ICD-10-CM | POA: Diagnosis not present

## 2013-07-06 DIAGNOSIS — Z79899 Other long term (current) drug therapy: Secondary | ICD-10-CM | POA: Diagnosis not present

## 2013-07-06 DIAGNOSIS — Z5181 Encounter for therapeutic drug level monitoring: Secondary | ICD-10-CM | POA: Diagnosis not present

## 2013-07-06 DIAGNOSIS — M2550 Pain in unspecified joint: Secondary | ICD-10-CM | POA: Diagnosis not present

## 2013-07-06 DIAGNOSIS — Z792 Long term (current) use of antibiotics: Secondary | ICD-10-CM | POA: Diagnosis not present

## 2013-08-10 DIAGNOSIS — H2510 Age-related nuclear cataract, unspecified eye: Secondary | ICD-10-CM | POA: Diagnosis not present

## 2013-08-10 DIAGNOSIS — H40019 Open angle with borderline findings, low risk, unspecified eye: Secondary | ICD-10-CM | POA: Diagnosis not present

## 2013-10-13 ENCOUNTER — Telehealth: Payer: Self-pay | Admitting: Family Medicine

## 2013-10-13 DIAGNOSIS — Z0000 Encounter for general adult medical examination without abnormal findings: Secondary | ICD-10-CM | POA: Insufficient documentation

## 2013-10-13 DIAGNOSIS — E785 Hyperlipidemia, unspecified: Secondary | ICD-10-CM

## 2013-10-13 DIAGNOSIS — M899 Disorder of bone, unspecified: Secondary | ICD-10-CM

## 2013-10-13 NOTE — Telephone Encounter (Signed)
Message copied by Judy Pimple on Wed Oct 13, 2013  7:53 AM ------      Message from: Alvina Chou      Created: Fri Oct 01, 2013  4:18 PM      Regarding: Lab orders for Thursday, 10.23.14       Patient is scheduled for CPX labs, please order future labs, Thanks , Terri       ------

## 2013-10-14 ENCOUNTER — Other Ambulatory Visit (INDEPENDENT_AMBULATORY_CARE_PROVIDER_SITE_OTHER): Payer: Medicare Other

## 2013-10-14 DIAGNOSIS — E785 Hyperlipidemia, unspecified: Secondary | ICD-10-CM

## 2013-10-14 DIAGNOSIS — Z0000 Encounter for general adult medical examination without abnormal findings: Secondary | ICD-10-CM | POA: Diagnosis not present

## 2013-10-14 DIAGNOSIS — M899 Disorder of bone, unspecified: Secondary | ICD-10-CM

## 2013-10-14 LAB — COMPREHENSIVE METABOLIC PANEL
ALT: 18 U/L (ref 0–35)
AST: 24 U/L (ref 0–37)
Albumin: 4.2 g/dL (ref 3.5–5.2)
Alkaline Phosphatase: 79 U/L (ref 39–117)
BUN: 12 mg/dL (ref 6–23)
CO2: 31 mEq/L (ref 19–32)
Calcium: 9.7 mg/dL (ref 8.4–10.5)
Chloride: 104 mEq/L (ref 96–112)
Creatinine, Ser: 0.8 mg/dL (ref 0.4–1.2)
GFR: 74.6 mL/min (ref >60.00)
Glucose, Bld: 99 mg/dL (ref 70–99)
Potassium: 5.5 mEq/L — ABNORMAL HIGH (ref 3.5–5.1)
Sodium: 142 mEq/L (ref 135–145)
Total Bilirubin: 0.5 mg/dL (ref 0.3–1.2)
Total Protein: 6.9 g/dL (ref 6.0–8.3)

## 2013-10-14 LAB — TSH: TSH: 2.15 u[IU]/mL (ref 0.35–5.50)

## 2013-10-14 LAB — CBC WITH DIFFERENTIAL/PLATELET
Basophils Absolute: 0 10*3/uL (ref 0.0–0.1)
Basophils Relative: 0.8 % (ref 0.0–3.0)
Eosinophils Absolute: 0 10*3/uL (ref 0.0–0.7)
Eosinophils Relative: 1.4 % (ref 0.0–5.0)
HCT: 38.6 % (ref 36.0–46.0)
Hemoglobin: 13 g/dL (ref 12.0–15.0)
Lymphocytes Relative: 34.3 % (ref 12.0–46.0)
Lymphs Abs: 1.2 10*3/uL (ref 0.7–4.0)
MCHC: 33.7 g/dL (ref 30.0–36.0)
MCV: 86.5 fl (ref 78.0–100.0)
Monocytes Absolute: 0.3 10*3/uL (ref 0.1–1.0)
Monocytes Relative: 8.5 % (ref 3.0–12.0)
Neutro Abs: 1.9 10*3/uL (ref 1.4–7.7)
Neutrophils Relative %: 55 % (ref 43.0–77.0)
Platelets: 166 10*3/uL (ref 150.0–400.0)
RBC: 4.46 Mil/uL (ref 3.87–5.11)
RDW: 14.2 % (ref 11.5–14.6)
WBC: 3.5 10*3/uL — ABNORMAL LOW (ref 4.5–10.5)

## 2013-10-14 LAB — LIPID PANEL
Cholesterol: 228 mg/dL — ABNORMAL HIGH (ref 0–200)
HDL: 90.3 mg/dL (ref >39.00)
Total CHOL/HDL Ratio: 3
Triglycerides: 79 mg/dL (ref 0.0–149.0)
VLDL: 15.8 mg/dL (ref 0.0–40.0)

## 2013-10-14 LAB — LDL CHOLESTEROL, DIRECT: Direct LDL: 124.9 mg/dL

## 2013-10-15 LAB — VITAMIN D 25 HYDROXY (VIT D DEFICIENCY, FRACTURES): Vit D, 25-Hydroxy: 41 ng/mL (ref 30–89)

## 2013-10-27 ENCOUNTER — Encounter: Payer: Self-pay | Admitting: Family Medicine

## 2013-10-27 ENCOUNTER — Ambulatory Visit (INDEPENDENT_AMBULATORY_CARE_PROVIDER_SITE_OTHER): Payer: Medicare Other | Admitting: Family Medicine

## 2013-10-27 ENCOUNTER — Encounter: Payer: Medicare Other | Admitting: Family Medicine

## 2013-10-27 VITALS — BP 116/64 | HR 62 | Temp 97.7°F | Ht 63.5 in | Wt 134.8 lb

## 2013-10-27 DIAGNOSIS — E875 Hyperkalemia: Secondary | ICD-10-CM | POA: Diagnosis not present

## 2013-10-27 DIAGNOSIS — E785 Hyperlipidemia, unspecified: Secondary | ICD-10-CM

## 2013-10-27 DIAGNOSIS — Z0000 Encounter for general adult medical examination without abnormal findings: Secondary | ICD-10-CM

## 2013-10-27 DIAGNOSIS — Z86010 Personal history of colonic polyps: Secondary | ICD-10-CM | POA: Diagnosis not present

## 2013-10-27 DIAGNOSIS — Z23 Encounter for immunization: Secondary | ICD-10-CM | POA: Diagnosis not present

## 2013-10-27 DIAGNOSIS — M899 Disorder of bone, unspecified: Secondary | ICD-10-CM

## 2013-10-27 LAB — POTASSIUM: Potassium: 4.9 mEq/L (ref 3.5–5.1)

## 2013-10-27 NOTE — Patient Instructions (Signed)
Don't forget to schedule your annual mammogram  Increase vitamin D over the counter to 1000 iu daily    (D3) Let's check on the price of a tetanus shot (Td)
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
## 2013-10-27 NOTE — Progress Notes (Signed)
Pre-visit discussion using our clinic review tool. No additional management support is needed unless otherwise documented below in the visit note.  

## 2013-10-28 ENCOUNTER — Other Ambulatory Visit: Payer: Self-pay

## 2013-10-28 ENCOUNTER — Encounter: Payer: Self-pay | Admitting: *Deleted

## 2013-10-28 DIAGNOSIS — Z1231 Encounter for screening mammogram for malignant neoplasm of breast: Secondary | ICD-10-CM

## 2013-10-28 NOTE — Assessment & Plan Note (Signed)
utd colonosc due 2021

## 2013-10-28 NOTE — Assessment & Plan Note (Signed)
Disc goals for lipids and reasons to control them Rev labs with pt Rev low sat fat diet in detail Great HDL

## 2013-10-28 NOTE — Assessment & Plan Note (Signed)
Reviewed health habits including diet and exercise and skin cancer prevention Also reviewed health mt list, fam hx and immunizations  See HPI Declines some imms

## 2013-10-28 NOTE — Assessment & Plan Note (Signed)
Re check today ? Hemolysis vs rel to water softener?  No symptoms

## 2013-10-28 NOTE — Assessment & Plan Note (Signed)
dexa stable 2013 osteopenia  One past fx D level ok - but ? If taking any Disc need for calcium/ vitamin D/ wt bearing exercise and bone density test every 2 y to monitor Disc safety/ fracture risk in detail

## 2013-12-07 ENCOUNTER — Ambulatory Visit: Payer: Medicare Other

## 2013-12-28 DIAGNOSIS — A6920 Lyme disease, unspecified: Secondary | ICD-10-CM | POA: Diagnosis not present

## 2013-12-28 DIAGNOSIS — R5381 Other malaise: Secondary | ICD-10-CM | POA: Diagnosis not present

## 2013-12-28 DIAGNOSIS — R5383 Other fatigue: Secondary | ICD-10-CM | POA: Diagnosis not present

## 2014-01-06 ENCOUNTER — Ambulatory Visit
Admission: RE | Admit: 2014-01-06 | Discharge: 2014-01-06 | Disposition: A | Discharge status: Home / Self Care | Payer: Medicare Other | Source: Ambulatory Visit

## 2014-01-06 DIAGNOSIS — Z1231 Encounter for screening mammogram for malignant neoplasm of breast: Secondary | ICD-10-CM

## 2014-01-07 ENCOUNTER — Encounter: Payer: Self-pay | Admitting: *Deleted

## 2014-01-24 DIAGNOSIS — D1801 Hemangioma of skin and subcutaneous tissue: Secondary | ICD-10-CM | POA: Diagnosis not present

## 2014-01-24 DIAGNOSIS — D485 Neoplasm of uncertain behavior of skin: Secondary | ICD-10-CM | POA: Diagnosis not present

## 2014-01-24 DIAGNOSIS — L905 Scar conditions and fibrosis of skin: Secondary | ICD-10-CM | POA: Diagnosis not present

## 2014-01-24 DIAGNOSIS — L821 Other seborrheic keratosis: Secondary | ICD-10-CM | POA: Diagnosis not present

## 2014-01-24 DIAGNOSIS — L820 Inflamed seborrheic keratosis: Secondary | ICD-10-CM | POA: Diagnosis not present

## 2014-01-24 DIAGNOSIS — Z85828 Personal history of other malignant neoplasm of skin: Secondary | ICD-10-CM | POA: Diagnosis not present

## 2014-04-05 DIAGNOSIS — H40019 Open angle with borderline findings, low risk, unspecified eye: Secondary | ICD-10-CM | POA: Diagnosis not present

## 2014-04-05 DIAGNOSIS — H2510 Age-related nuclear cataract, unspecified eye: Secondary | ICD-10-CM | POA: Diagnosis not present

## 2014-04-25 ENCOUNTER — Ambulatory Visit: Payer: Medicare Other | Admitting: Family Medicine

## 2014-05-04 ENCOUNTER — Ambulatory Visit (INDEPENDENT_AMBULATORY_CARE_PROVIDER_SITE_OTHER)
Admission: RE | Admit: 2014-05-04 | Discharge: 2014-05-04 | Disposition: A | Discharge status: Home / Self Care | Payer: Medicare Other | Source: Ambulatory Visit | Attending: Family Medicine | Admitting: Family Medicine

## 2014-05-04 ENCOUNTER — Encounter: Payer: Self-pay | Admitting: Family Medicine

## 2014-05-04 ENCOUNTER — Ambulatory Visit (INDEPENDENT_AMBULATORY_CARE_PROVIDER_SITE_OTHER): Payer: Medicare Other | Admitting: Family Medicine

## 2014-05-04 VITALS — BP 122/84 | HR 59 | Temp 98.3°F | Ht 63.5 in | Wt 134.5 lb

## 2014-05-04 DIAGNOSIS — M25559 Pain in unspecified hip: Secondary | ICD-10-CM | POA: Diagnosis not present

## 2014-05-04 DIAGNOSIS — R1032 Left lower quadrant pain: Secondary | ICD-10-CM

## 2014-05-04 DIAGNOSIS — R109 Unspecified abdominal pain: Secondary | ICD-10-CM

## 2014-05-04 LAB — POCT URINALYSIS DIPSTICK
Bilirubin, UA: NEGATIVE
Glucose, UA: NEGATIVE
Ketones, UA: NEGATIVE
Nitrite, UA: NEGATIVE
Protein, UA: NEGATIVE
Spec Grav, UA: 1.01
Urobilinogen, UA: 0.2
pH, UA: 6

## 2014-05-04 NOTE — Progress Notes (Signed)
Subjective:    Patient ID: Melissa Blackwell, female    DOB: 08/10/1946, 68 y.o.   MRN: [id number]  HPI Has pain in her L groin area  Notices it in am when she stretches and when she steps up on to treadmill Pain when on treadmill at time  Some days worse than others  Dull pain  Does not radiate  In general leg movement eff it   No urinary symptoms  Perhaps an odor  Has had a hysterectomy in the past  (wonders adhesions)   No hx of hip arthritis  Patient Active Problem List   Diagnosis Date Noted  . Hyperkalemia 10/27/2013  . Encounter for Medicare annual wellness exam 10/13/2013  . Other screening mammogram 10/15/2011  . DIVERTICULOSIS, COLON 08/23/2010  . COLONIC POLYPS, ADENOMATOUS, HX OF 08/23/2010  . UNSPEC SYMPTOM ASSOC W/FEMALE GENITAL ORGANS 08/11/2009  . LYME DISEASE 08/10/2007  . HEMORRHOIDS 07/21/2007  . CONSTIPATION, CHRONIC 07/21/2007  . OSTEOPENIA 07/21/2007  . RHEUMATOID FACTOR, POSITIVE 07/21/2007  . SKIN CANCER, HX OF 07/21/2007   Past Medical History  Diagnosis Date  . Cancer     skin CA Hx of squamous cell in situ  . Hyperlipidemia   . Osteopenia   . Lyme disease 06/2007    Dr Knox Royalty  . Hx of adenomatous colonic polyps   . GI bleed   . Internal hemorrhoids   . Diverticulosis of sigmoid colon     mild  . Renal cyst     right renal cyst   Past Surgical History  Procedure Laterality Date  . Hemorroidectomy  04/2001  . Abdominal hysterectomy  03/1996    partial fibroids  . Scar removal      keloid  scar  . Spine surgery      spinal fusion  . Rhinoplasty    . Hemorrhoid banding     History  Substance Use Topics  . Smoking status: Never Smoker   . Smokeless tobacco: Not on file  . Alcohol Use: No   Family History  Problem Relation Age of Onset  . Cancer Mother     pancreatic CA  . Heart disease Mother   . Heart disease Father     CHF and heart problems  . Hyperlipidemia Sister   . Cancer Sister     uterine CA  . Heart disease  Maternal Grandmother    No Known Allergies Current Outpatient Prescriptions on File Prior to Visit  Medication Sig Dispense Refill  . Acetylcysteine (N-ACETYL-L-CYSTEINE) 600 MG CAPS Take 1 tablet by mouth daily.      . calcium carbonate (OS-CAL) 600 MG TABS Take 600 mg by mouth 2 (two) times daily with a meal.        . KRILL OIL PO Take 1 capsule by mouth daily.        . NON FORMULARY Cowden herbal protocol for lyme disease as directed       . NON FORMULARY 500 mg 2 (two) times daily. A-H-C-C (IMMUNE SYSTEM SUPPORT)      . PHOSPHATIDYL CHOLINE PO Take 420 mg by mouth daily.      . Probiotic Product (PROBIOTIC PO) Take 1 capsule by mouth daily.        . Red Yeast Rice Extract 600 MG CAPS Take 1 capsule by mouth 2 (two) times daily.        Marland Kitchen Resveratrol 250 MG CAPS Take 1 capsule by mouth daily.        No  current facility-administered medications on file prior to visit.     Review of Systems Review of Systems  Constitutional: Negative for fever, appetite change, fatigue and unexpected weight change.  Eyes: Negative for pain and visual disturbance.  Respiratory: Negative for cough and shortness of breath.   Cardiovascular: Negative for cp or palpitations    Gastrointestinal: Negative for nausea, diarrhea and constipation. neg for blood in stool or vaginal bleeding  Genitourinary: Negative for urgency and frequency.  Skin: Negative for pallor or rash   Neurological: Negative for weakness, light-headedness, numbness and headaches.  Hematological: Negative for adenopathy. Does not bruise/bleed easily.  Psychiatric/Behavioral: Negative for dysphoric mood. The patient is not nervous/anxious.         Objective:   Physical Exam  Constitutional: She appears well-developed and well-nourished. No distress.  HENT:  Head: Normocephalic and atraumatic.  Mouth/Throat: Oropharynx is clear and moist.  Eyes: Conjunctivae and EOM are normal. Pupils are equal, round, and reactive to light. No  scleral icterus.  Neck: Normal range of motion. Neck supple. No JVD present. No thyromegaly present.  Cardiovascular: Normal rate, regular rhythm, normal heart sounds and intact distal pulses.  Exam reveals no gallop.   Pulmonary/Chest: Effort normal and breath sounds normal. No respiratory distress. She has no rales.  Abdominal: Soft. Bowel sounds are normal. She exhibits no distension and no mass. There is no tenderness. There is no rebound and no guarding.  No cva tenderness   Musculoskeletal: She exhibits no edema and no tenderness.  Nl rom L hip / no tenderness of femur or trochanter   Lymphadenopathy:    She has no cervical adenopathy.  Neurological: She is alert.  Skin: Skin is warm and dry. No rash noted. No erythema. No pallor.  Psychiatric: She has a normal mood and affect.          Assessment & Plan:

## 2014-05-04 NOTE — Patient Instructions (Signed)
Leave a urine sample on your way out  Hip xray now  Use heat if needed  Stay active but avoid very high impact activities

## 2014-05-04 NOTE — Progress Notes (Signed)
Pre visit review using our clinic review tool, if applicable. No additional management support is needed unless otherwise documented below in the visit note. 

## 2014-05-05 ENCOUNTER — Telehealth: Payer: Self-pay | Admitting: Family Medicine

## 2014-05-05 DIAGNOSIS — R1032 Left lower quadrant pain: Secondary | ICD-10-CM

## 2014-05-05 NOTE — Telephone Encounter (Signed)
Ultrasound ordered

## 2014-05-05 NOTE — Assessment & Plan Note (Addendum)
Nl exam but symptoms are consistent with hip arthritis Hip xray today  Will try again to also get urine specimen  Hx of diverticulosis - with colonosc in 2011 - but no tenderness on exam today If all normal consider pelvic US to vis ovary

## 2014-05-05 NOTE — Telephone Encounter (Signed)
Message copied by Abner Greenspan on Thu May 05, 2014  8:27 PM ------      Message from: Ander Gaster B      Created: Thu May 05, 2014  9:43 AM       Pt would like to proceed with ultrasound. ------

## 2014-05-11 ENCOUNTER — Ambulatory Visit
Admission: RE | Admit: 2014-05-11 | Discharge: 2014-05-11 | Disposition: A | Discharge status: Home / Self Care | Payer: Medicare Other | Source: Ambulatory Visit | Attending: Family Medicine | Admitting: Family Medicine

## 2014-05-11 DIAGNOSIS — R1032 Left lower quadrant pain: Secondary | ICD-10-CM

## 2014-05-11 DIAGNOSIS — N949 Unspecified condition associated with female genital organs and menstrual cycle: Secondary | ICD-10-CM | POA: Diagnosis not present

## 2014-06-02 DIAGNOSIS — H6120 Impacted cerumen, unspecified ear: Secondary | ICD-10-CM | POA: Diagnosis not present

## 2014-06-29 DIAGNOSIS — A6920 Lyme disease, unspecified: Secondary | ICD-10-CM | POA: Diagnosis not present

## 2014-07-27 DIAGNOSIS — L820 Inflamed seborrheic keratosis: Secondary | ICD-10-CM | POA: Diagnosis not present

## 2014-07-27 DIAGNOSIS — L821 Other seborrheic keratosis: Secondary | ICD-10-CM | POA: Diagnosis not present

## 2014-07-27 DIAGNOSIS — Z85828 Personal history of other malignant neoplasm of skin: Secondary | ICD-10-CM | POA: Diagnosis not present

## 2014-07-27 DIAGNOSIS — L905 Scar conditions and fibrosis of skin: Secondary | ICD-10-CM | POA: Diagnosis not present

## 2014-10-23 ENCOUNTER — Telehealth: Payer: Self-pay | Admitting: Family Medicine

## 2014-10-23 DIAGNOSIS — Z0000 Encounter for general adult medical examination without abnormal findings: Secondary | ICD-10-CM

## 2014-10-23 DIAGNOSIS — M8580 Other specified disorders of bone density and structure, unspecified site: Secondary | ICD-10-CM

## 2014-10-23 NOTE — Telephone Encounter (Signed)
-----   Message from Ellamae Sia sent at 10/19/2014 11:44 AM EDT ----- Regarding: Lab orders for Monday, 11.2.15 Patient is scheduled for CPX labs, please order future labs, Thanks , Karna Christmas

## 2014-10-24 ENCOUNTER — Other Ambulatory Visit (INDEPENDENT_AMBULATORY_CARE_PROVIDER_SITE_OTHER): Payer: Medicare Other

## 2014-10-24 DIAGNOSIS — E559 Vitamin D deficiency, unspecified: Secondary | ICD-10-CM

## 2014-10-24 DIAGNOSIS — Z0000 Encounter for general adult medical examination without abnormal findings: Secondary | ICD-10-CM

## 2014-10-24 DIAGNOSIS — E785 Hyperlipidemia, unspecified: Secondary | ICD-10-CM | POA: Diagnosis not present

## 2014-10-24 DIAGNOSIS — M8580 Other specified disorders of bone density and structure, unspecified site: Secondary | ICD-10-CM

## 2014-10-24 DIAGNOSIS — I10 Essential (primary) hypertension: Secondary | ICD-10-CM | POA: Diagnosis not present

## 2014-10-24 LAB — CBC WITH DIFFERENTIAL/PLATELET
Basophils Absolute: 0 10*3/uL (ref 0.0–0.1)
Basophils Relative: 0.6 % (ref 0.0–3.0)
Eosinophils Absolute: 0 10*3/uL (ref 0.0–0.7)
Eosinophils Relative: 0.4 % (ref 0.0–5.0)
HCT: 39.6 % (ref 36.0–46.0)
Hemoglobin: 12.9 g/dL (ref 12.0–15.0)
Lymphocytes Relative: 21.1 % (ref 12.0–46.0)
Lymphs Abs: 1.2 10*3/uL (ref 0.7–4.0)
MCHC: 32.5 g/dL (ref 30.0–36.0)
MCV: 86.7 fl (ref 78.0–100.0)
Monocytes Absolute: 0.4 10*3/uL (ref 0.1–1.0)
Monocytes Relative: 6.8 % (ref 3.0–12.0)
Neutro Abs: 4 10*3/uL (ref 1.4–7.7)
Neutrophils Relative %: 71.1 % (ref 43.0–77.0)
Platelets: 176 10*3/uL (ref 150.0–400.0)
RBC: 4.57 Mil/uL (ref 3.87–5.11)
RDW: 14.5 % (ref 11.5–15.5)
WBC: 5.7 10*3/uL (ref 4.0–10.5)

## 2014-10-24 LAB — COMPREHENSIVE METABOLIC PANEL
ALT: 23 U/L (ref 0–35)
AST: 23 U/L (ref 0–37)
Albumin: 3.7 g/dL (ref 3.5–5.2)
Alkaline Phosphatase: 72 U/L (ref 39–117)
BUN: 16 mg/dL (ref 6–23)
CO2: 25 mEq/L (ref 19–32)
Calcium: 9.2 mg/dL (ref 8.4–10.5)
Chloride: 105 mEq/L (ref 96–112)
Creatinine, Ser: 0.7 mg/dL (ref 0.4–1.2)
GFR: 88.02 mL/min (ref >60.00)
Glucose, Bld: 90 mg/dL (ref 70–99)
Potassium: 4.6 mEq/L (ref 3.5–5.1)
Sodium: 139 mEq/L (ref 135–145)
Total Bilirubin: 0.5 mg/dL (ref 0.2–1.2)
Total Protein: 6.9 g/dL (ref 6.0–8.3)

## 2014-10-24 LAB — TSH: TSH: 1.5 u[IU]/mL (ref 0.35–4.50)

## 2014-10-24 LAB — LIPID PANEL
Cholesterol: 225 mg/dL — ABNORMAL HIGH (ref 0–200)
HDL: 68.9 mg/dL (ref >39.00)
LDL Cholesterol: 142 mg/dL — ABNORMAL HIGH (ref 0–99)
NonHDL: 156.1
Total CHOL/HDL Ratio: 3
Triglycerides: 69 mg/dL (ref 0.0–149.0)
VLDL: 13.8 mg/dL (ref 0.0–40.0)

## 2014-10-24 LAB — VITAMIN D 25 HYDROXY (VIT D DEFICIENCY, FRACTURES): VITD: 29.95 ng/mL — ABNORMAL LOW (ref 30.00–100.00)

## 2014-10-25 ENCOUNTER — Other Ambulatory Visit: Payer: Medicare Other

## 2014-10-31 ENCOUNTER — Ambulatory Visit (INDEPENDENT_AMBULATORY_CARE_PROVIDER_SITE_OTHER): Payer: Medicare Other | Admitting: Family Medicine

## 2014-10-31 ENCOUNTER — Encounter: Payer: Self-pay | Admitting: Family Medicine

## 2014-10-31 ENCOUNTER — Encounter (INDEPENDENT_AMBULATORY_CARE_PROVIDER_SITE_OTHER): Payer: Self-pay

## 2014-10-31 VITALS — BP 136/86 | HR 68 | Temp 98.2°F | Ht 63.25 in | Wt 137.5 lb

## 2014-10-31 DIAGNOSIS — M8580 Other specified disorders of bone density and structure, unspecified site: Secondary | ICD-10-CM | POA: Diagnosis not present

## 2014-10-31 DIAGNOSIS — Z0000 Encounter for general adult medical examination without abnormal findings: Secondary | ICD-10-CM | POA: Diagnosis not present

## 2014-10-31 DIAGNOSIS — E559 Vitamin D deficiency, unspecified: Secondary | ICD-10-CM | POA: Diagnosis not present

## 2014-10-31 DIAGNOSIS — E785 Hyperlipidemia, unspecified: Secondary | ICD-10-CM | POA: Insufficient documentation

## 2014-10-31 NOTE — Progress Notes (Signed)
Pre visit review using our clinic review tool, if applicable. No additional management support is needed unless otherwise documented below in the visit note. 

## 2014-10-31 NOTE — Patient Instructions (Signed)
Cholesterol is not as good Work on diet to lower LDL (bad chol)    Avoid red meat/ fried foods/ egg yolks/ fatty breakfast meats/ butter, cheese and high fat dairy/ and shellfish    Work on exercise to increase the HDL (good cholesterol)   Take care of yourself   Get an extra vitamin D 2000 iu and take one daily in addition to your other vitamins

## 2014-10-31 NOTE — Assessment & Plan Note (Addendum)
Will be due for dexa in the next year -will schedule for January  No fx or falls Need to inc D level -will add another 2000 iu daily  Disc need for calcium/ vitamin D/ wt bearing exercise and bone density test every 2 y to monitor Disc safety/ fracture risk in detail

## 2014-10-31 NOTE — Assessment & Plan Note (Signed)
HDL down and LDL up due to change in health habits Disc goals for lipids and reasons to control them Rev labs with pt Rev low sat fat diet in detail Suspect this will return to prior readings when back to regular habits

## 2014-10-31 NOTE — Assessment & Plan Note (Signed)
Reviewed health habits including diet and exercise and skin cancer prevention Reviewed appropriate screening tests for age  Also reviewed health mt list, fam hx and immunization status , as well as social and family history   See HPI Has advance directive  Declines all imms except tetanus shot  Colon cancer screening is up to date  dexa sched for Jan  Labs reviwed

## 2014-10-31 NOTE — Assessment & Plan Note (Signed)
Will plan to add 2000 more iu daily otc  Pt aware this will benefit bone health as well as general health

## 2014-10-31 NOTE — Progress Notes (Signed)
Subjective:    Patient ID: Melissa Blackwell, female    DOB: 1946/10/14, 68 y.o.   MRN: [id number]  HPI Here for annual medicare wellness and also for acute and chronic medical problems  I have personally reviewed the Medicare Annual Wellness questionnaire and have noted 1. The patient's medical and social history 2. Their use of alcohol, tobacco or illicit drugs 3. Their current medications and supplements 4. The patient's functional ability including ADL's, fall risks, home safety risks and hearing or visual             impairment. 5. Diet and physical activities 6. Evidence for depression or mood disorders  The patients weight, height, BMI have been recorded in the chart and visual acuity is per eye clinic.  I have made referrals, counseling and provided education to the patient based review of the above and I have provided the pt with a written personalized care plan for preventive services.  She is feeling fine  Nothing new going on   Wt is up 3 lb with bmi of 24  She started eating egg and sausage instead of oatmeal , also more cheese and shellfish lately     See scanned forms.  Routine anticipatory guidance given to patient.  See health maintenance. Colon cancer screening 10/11- 10 year recall  Breast cancer screening nl mammogram 1/15 Self breast exam normal  No lumps on self exam  No gyn problems and she has had a hysterectomy  Flu vaccine- declines due to fear of mercury  Tetanus vaccine 11/14 Pneumovax- declines that and prevnar due to fear of mercury  Zoster vaccine- declines   Advance directive- has that written up  Cognitive function addressed- see scanned forms- and if abnormal then additional documentation follows. No issues/ no worries about memory    PMH and SH reviewed  Meds, vitals, and allergies reviewed.   ROS: See HPI.  Otherwise negative.    Osteopenia  dexa 12/13 stable osteopenia  D level is 29.9 - she is taking D in her calcium and also red  yeast rice  Does not take it all the time = she will get more and add 2000 iu daily  No falls or fractures     Chemistry      Component Value Date/Time   NA 139 10/24/2014 1005   K 4.6 10/24/2014 1005   CL 105 10/24/2014 1005   CO2 25 10/24/2014 1005   BUN 16 10/24/2014 1005   CREATININE 0.7 10/24/2014 1005      Component Value Date/Time   CALCIUM 9.2 10/24/2014 1005   ALKPHOS 72 10/24/2014 1005   AST 23 10/24/2014 1005   ALT 23 10/24/2014 1005   BILITOT 0.5 10/24/2014 1005     glucose is 90  Lab Results  Component Value Date   WBC 5.7 10/24/2014   HGB 12.9 10/24/2014   HCT 39.6 10/24/2014   MCV 86.7 10/24/2014   PLT 176.0 10/24/2014    Lab Results  Component Value Date   TSH 1.50 10/24/2014    Cholesterol Lab Results  Component Value Date   CHOL 225* 10/24/2014   CHOL 228* 10/14/2013   CHOL 230* 10/07/2012   Lab Results  Component Value Date   HDL 68.90 10/24/2014   HDL 90.30 10/14/2013   HDL 72.30 10/07/2012   Lab Results  Component Value Date   LDLCALC 142* 10/24/2014   Lab Results  Component Value Date   TRIG 69.0 10/24/2014   TRIG 79.0 10/14/2013  TRIG 64.0 10/07/2012   Lab Results  Component Value Date   CHOLHDL 3 10/24/2014   CHOLHDL 3 10/14/2013   CHOLHDL 3 10/07/2012   Lab Results  Component Value Date   LDLDIRECT 124.9 10/14/2013   LDLDIRECT 148.5 10/07/2012   LDLDIRECT 129.0 10/10/2011   HDL came down significantly  LDL up from diet -more eggs and bacon and shellfish  Has not exercised in the past 2 weeks -on vacation   Suspects she can get it back to where it was   Patient Active Problem List   Diagnosis Date Noted  . Left groin pain 05/04/2014  . Hyperkalemia 10/27/2013  . Encounter for Medicare annual wellness exam 10/13/2013  . Other screening mammogram 10/15/2011  . DIVERTICULOSIS, COLON 08/23/2010  . COLONIC POLYPS, ADENOMATOUS, HX OF 08/23/2010  . UNSPEC SYMPTOM ASSOC W/FEMALE GENITAL ORGANS 08/11/2009  . LYME  DISEASE 08/10/2007  . HEMORRHOIDS 07/21/2007  . CONSTIPATION, CHRONIC 07/21/2007  . Osteopenia 07/21/2007  . RHEUMATOID FACTOR, POSITIVE 07/21/2007  . SKIN CANCER, HX OF 07/21/2007   Past Medical History  Diagnosis Date  . Cancer     skin CA Hx of squamous cell in situ  . Hyperlipidemia   . Osteopenia   . Lyme disease 06/2007    Dr Knox Royalty  . Hx of adenomatous colonic polyps   . GI bleed   . Internal hemorrhoids   . Diverticulosis of sigmoid colon     mild  . Renal cyst     right renal cyst   Past Surgical History  Procedure Laterality Date  . Hemorroidectomy  04/2001  . Abdominal hysterectomy  03/1996    partial fibroids  . Scar removal      keloid  scar  . Spine surgery      spinal fusion  . Rhinoplasty    . Hemorrhoid banding     History  Substance Use Topics  . Smoking status: Never Smoker   . Smokeless tobacco: Not on file  . Alcohol Use: No   Family History  Problem Relation Age of Onset  . Cancer Mother     pancreatic CA  . Heart disease Mother   . Heart disease Father     CHF and heart problems  . Hyperlipidemia Sister   . Cancer Sister     uterine CA  . Heart disease Maternal Grandmother    No Known Allergies Current Outpatient Prescriptions on File Prior to Visit  Medication Sig Dispense Refill  . Acetylcysteine (N-ACETYL-L-CYSTEINE) 600 MG CAPS Take 1 tablet by mouth daily.    . calcium carbonate (OS-CAL) 600 MG TABS Take 600 mg by mouth 2 (two) times daily with a meal.      . KRILL OIL PO Take 1 capsule by mouth daily.      . NON FORMULARY Cowden herbal protocol for lyme disease as directed     . NON FORMULARY 500 mg 2 (two) times daily. A-H-C-C (IMMUNE SYSTEM SUPPORT)    . NON FORMULARY Take 1 tablet by mouth daily. BACOPA    . PHOSPHATIDYL CHOLINE PO Take 420 mg by mouth daily.    . Probiotic Product (PROBIOTIC PO) Take 1 capsule by mouth daily.      . Red Yeast Rice Extract 600 MG CAPS Take 1 capsule by mouth 2 (two) times daily.      Marland Kitchen  Resveratrol 250 MG CAPS Take 1 capsule by mouth daily.      No current facility-administered medications on file prior to visit.  Review of Systems Review of Systems  Constitutional: Negative for fever, appetite change, fatigue and unexpected weight change.  Eyes: Negative for pain and visual disturbance.  Respiratory: Negative for cough and shortness of breath.   Cardiovascular: Negative for cp or palpitations    Gastrointestinal: Negative for nausea, diarrhea and constipation.  Genitourinary: Negative for urgency and frequency.  Skin: Negative for pallor or rash   Neurological: Negative for weakness, light-headedness, numbness and headaches.  Hematological: Negative for adenopathy. Does not bruise/bleed easily.  Psychiatric/Behavioral: Negative for dysphoric mood. The patient is not nervous/anxious.         Objective:   Physical Exam  Constitutional: She appears well-developed and well-nourished. No distress.  HENT:  Head: Normocephalic and atraumatic.  Right Ear: External ear normal.  Left Ear: External ear normal.  Mouth/Throat: Oropharynx is clear and moist.  Eyes: Conjunctivae and EOM are normal. Pupils are equal, round, and reactive to light. No scleral icterus.  Neck: Normal range of motion. Neck supple. No JVD present. Carotid bruit is not present. No thyromegaly present.  Cardiovascular: Normal rate, regular rhythm, normal heart sounds and intact distal pulses.  Exam reveals no gallop.   Pulmonary/Chest: Effort normal and breath sounds normal. No respiratory distress. She has no wheezes. She exhibits no tenderness.  Abdominal: Soft. Bowel sounds are normal. She exhibits no distension, no abdominal bruit and no mass. There is no tenderness.  Genitourinary: No breast swelling, tenderness, discharge or bleeding.  Breast exam: No mass, nodules, thickening, tenderness, bulging, retraction, inflamation, nipple discharge or skin changes noted.  No axillary or clavicular LA.       Musculoskeletal: Normal range of motion. She exhibits no edema or tenderness.  No kyphosis No acute joitn changes   Lymphadenopathy:    She has no cervical adenopathy.  Neurological: She is alert. She has normal reflexes. No cranial nerve deficit. She exhibits normal muscle tone. Coordination normal.  Skin: Skin is warm and dry. No rash noted. No erythema. No pallor.  lentigos and SKs present   Psychiatric: She has a normal mood and affect.          Assessment & Plan:   Problem List Items Addressed This Visit      Musculoskeletal and Integument   Osteopenia    Will be due for dexa in the next year -will schedule for January  No fx or falls Need to inc D level -will add another 2000 iu daily  Disc need for calcium/ vitamin D/ wt bearing exercise and bone density test every 2 y to monitor Disc safety/ fracture risk in detail      Relevant Orders      DG Bone Density     Other   Encounter for Medicare annual wellness exam - Primary    Reviewed health habits including diet and exercise and skin cancer prevention Reviewed appropriate screening tests for age  Also reviewed health mt list, fam hx and immunization status , as well as social and family history   See HPI Has advance directive  Declines all imms except tetanus shot  Colon cancer screening is up to date  dexa sched for Jan  Labs reviwed     Hyperlipidemia    HDL down and LDL up due to change in health habits Disc goals for lipids and reasons to control them Rev labs with pt Rev low sat fat diet in detail Suspect this will return to prior readings when back to regular habits     Vitamin  D deficiency    Will plan to add 2000 more iu daily otc  Pt aware this will benefit bone health as well as general health

## 2014-11-09 DIAGNOSIS — R05 Cough: Secondary | ICD-10-CM | POA: Diagnosis not present

## 2014-11-09 DIAGNOSIS — T7840XA Allergy, unspecified, initial encounter: Secondary | ICD-10-CM | POA: Diagnosis not present

## 2014-12-01 ENCOUNTER — Other Ambulatory Visit: Payer: Self-pay

## 2014-12-01 DIAGNOSIS — Z1231 Encounter for screening mammogram for malignant neoplasm of breast: Secondary | ICD-10-CM

## 2014-12-27 ENCOUNTER — Other Ambulatory Visit: Payer: Medicare Other

## 2015-01-09 ENCOUNTER — Ambulatory Visit
Admission: RE | Admit: 2015-01-09 | Discharge: 2015-01-09 | Disposition: A | Discharge status: Home / Self Care | Payer: Medicare Other | Source: Ambulatory Visit

## 2015-01-09 ENCOUNTER — Ambulatory Visit
Admission: RE | Admit: 2015-01-09 | Discharge: 2015-01-09 | Disposition: A | Discharge status: Home / Self Care | Payer: Medicare Other | Source: Ambulatory Visit | Attending: Family Medicine | Admitting: Family Medicine

## 2015-01-09 DIAGNOSIS — Z1231 Encounter for screening mammogram for malignant neoplasm of breast: Secondary | ICD-10-CM | POA: Diagnosis not present

## 2015-01-09 DIAGNOSIS — M85852 Other specified disorders of bone density and structure, left thigh: Secondary | ICD-10-CM | POA: Diagnosis not present

## 2015-01-09 DIAGNOSIS — Z780 Asymptomatic menopausal state: Secondary | ICD-10-CM | POA: Diagnosis not present

## 2015-01-09 DIAGNOSIS — M8580 Other specified disorders of bone density and structure, unspecified site: Secondary | ICD-10-CM

## 2015-01-09 DIAGNOSIS — M8588 Other specified disorders of bone density and structure, other site: Secondary | ICD-10-CM | POA: Diagnosis not present

## 2015-01-16 ENCOUNTER — Encounter: Payer: Self-pay | Admitting: *Deleted

## 2015-01-16 ENCOUNTER — Encounter: Payer: Self-pay | Admitting: Family Medicine

## 2015-01-16 LAB — HM MAMMOGRAPHY: HM Mammogram: NORMAL

## 2015-01-24 DIAGNOSIS — L905 Scar conditions and fibrosis of skin: Secondary | ICD-10-CM | POA: Diagnosis not present

## 2015-01-24 DIAGNOSIS — B079 Viral wart, unspecified: Secondary | ICD-10-CM | POA: Diagnosis not present

## 2015-01-24 DIAGNOSIS — L821 Other seborrheic keratosis: Secondary | ICD-10-CM | POA: Diagnosis not present

## 2015-01-24 DIAGNOSIS — D1801 Hemangioma of skin and subcutaneous tissue: Secondary | ICD-10-CM | POA: Diagnosis not present

## 2015-01-24 DIAGNOSIS — Z85828 Personal history of other malignant neoplasm of skin: Secondary | ICD-10-CM | POA: Diagnosis not present

## 2015-01-24 DIAGNOSIS — B009 Herpesviral infection, unspecified: Secondary | ICD-10-CM | POA: Diagnosis not present

## 2015-01-26 DIAGNOSIS — R5383 Other fatigue: Secondary | ICD-10-CM | POA: Diagnosis not present

## 2015-01-26 DIAGNOSIS — A6920 Lyme disease, unspecified: Secondary | ICD-10-CM | POA: Diagnosis not present

## 2015-01-26 DIAGNOSIS — R5381 Other malaise: Secondary | ICD-10-CM | POA: Diagnosis not present

## 2015-02-14 DIAGNOSIS — B079 Viral wart, unspecified: Secondary | ICD-10-CM | POA: Diagnosis not present

## 2015-03-07 DIAGNOSIS — B079 Viral wart, unspecified: Secondary | ICD-10-CM | POA: Diagnosis not present

## 2015-04-10 DIAGNOSIS — L821 Other seborrheic keratosis: Secondary | ICD-10-CM | POA: Diagnosis not present

## 2015-04-10 DIAGNOSIS — L820 Inflamed seborrheic keratosis: Secondary | ICD-10-CM | POA: Diagnosis not present

## 2015-04-10 DIAGNOSIS — B079 Viral wart, unspecified: Secondary | ICD-10-CM | POA: Diagnosis not present

## 2015-04-10 DIAGNOSIS — L905 Scar conditions and fibrosis of skin: Secondary | ICD-10-CM | POA: Diagnosis not present

## 2015-04-10 DIAGNOSIS — Z85828 Personal history of other malignant neoplasm of skin: Secondary | ICD-10-CM | POA: Diagnosis not present

## 2015-04-11 DIAGNOSIS — H524 Presbyopia: Secondary | ICD-10-CM | POA: Diagnosis not present

## 2015-04-11 DIAGNOSIS — H40013 Open angle with borderline findings, low risk, bilateral: Secondary | ICD-10-CM | POA: Diagnosis not present

## 2015-04-11 DIAGNOSIS — H2513 Age-related nuclear cataract, bilateral: Secondary | ICD-10-CM | POA: Diagnosis not present

## 2015-07-12 DIAGNOSIS — H6123 Impacted cerumen, bilateral: Secondary | ICD-10-CM | POA: Diagnosis not present

## 2015-07-26 DIAGNOSIS — A6920 Lyme disease, unspecified: Secondary | ICD-10-CM | POA: Diagnosis not present

## 2015-10-09 DIAGNOSIS — R350 Frequency of micturition: Secondary | ICD-10-CM | POA: Diagnosis not present

## 2015-10-31 DIAGNOSIS — L905 Scar conditions and fibrosis of skin: Secondary | ICD-10-CM | POA: Diagnosis not present

## 2015-10-31 DIAGNOSIS — L820 Inflamed seborrheic keratosis: Secondary | ICD-10-CM | POA: Diagnosis not present

## 2015-10-31 DIAGNOSIS — L821 Other seborrheic keratosis: Secondary | ICD-10-CM | POA: Diagnosis not present

## 2015-10-31 DIAGNOSIS — B009 Herpesviral infection, unspecified: Secondary | ICD-10-CM | POA: Diagnosis not present

## 2015-10-31 DIAGNOSIS — D1801 Hemangioma of skin and subcutaneous tissue: Secondary | ICD-10-CM | POA: Diagnosis not present

## 2015-11-05 ENCOUNTER — Telehealth: Payer: Self-pay | Admitting: Family Medicine

## 2015-11-05 DIAGNOSIS — E875 Hyperkalemia: Secondary | ICD-10-CM

## 2015-11-05 DIAGNOSIS — E559 Vitamin D deficiency, unspecified: Secondary | ICD-10-CM

## 2015-11-05 DIAGNOSIS — R5383 Other fatigue: Secondary | ICD-10-CM | POA: Insufficient documentation

## 2015-11-05 DIAGNOSIS — E785 Hyperlipidemia, unspecified: Secondary | ICD-10-CM

## 2015-11-05 NOTE — Telephone Encounter (Signed)
-----   Message from Marchia Bond sent at 11/01/2015  9:14 AM EST ----- Regarding: Cpx labs Mon 11/14 need orders, thanks :-) Please order  future cpx labs for pt's upcoming lab appt. Thanks Aniceto Boss

## 2015-11-06 ENCOUNTER — Encounter: Payer: Medicare Other | Admitting: Family Medicine

## 2015-11-06 ENCOUNTER — Other Ambulatory Visit (INDEPENDENT_AMBULATORY_CARE_PROVIDER_SITE_OTHER): Payer: Medicare Other

## 2015-11-06 DIAGNOSIS — E559 Vitamin D deficiency, unspecified: Secondary | ICD-10-CM | POA: Diagnosis not present

## 2015-11-06 DIAGNOSIS — R5383 Other fatigue: Secondary | ICD-10-CM | POA: Diagnosis not present

## 2015-11-06 DIAGNOSIS — E875 Hyperkalemia: Secondary | ICD-10-CM | POA: Diagnosis not present

## 2015-11-06 DIAGNOSIS — E785 Hyperlipidemia, unspecified: Secondary | ICD-10-CM | POA: Diagnosis not present

## 2015-11-06 LAB — COMPREHENSIVE METABOLIC PANEL
ALT: 13 U/L (ref 0–35)
AST: 16 U/L (ref 0–37)
Albumin: 4.4 g/dL (ref 3.5–5.2)
Alkaline Phosphatase: 71 U/L (ref 39–117)
BUN: 17 mg/dL (ref 6–23)
CO2: 30 mEq/L (ref 19–32)
Calcium: 9.9 mg/dL (ref 8.4–10.5)
Chloride: 105 mEq/L (ref 96–112)
Creatinine, Ser: 0.73 mg/dL (ref 0.40–1.20)
GFR: 83.6 mL/min (ref >60.00)
Glucose, Bld: 91 mg/dL (ref 70–99)
Potassium: 4.8 mEq/L (ref 3.5–5.1)
Sodium: 142 mEq/L (ref 135–145)
Total Bilirubin: 0.5 mg/dL (ref 0.2–1.2)
Total Protein: 7 g/dL (ref 6.0–8.3)

## 2015-11-06 LAB — TSH: TSH: 1.85 u[IU]/mL (ref 0.35–4.50)

## 2015-11-06 LAB — CBC WITH DIFFERENTIAL/PLATELET
Basophils Absolute: 0 10*3/uL (ref 0.0–0.1)
Basophils Relative: 0.7 % (ref 0.0–3.0)
Eosinophils Absolute: 0 10*3/uL (ref 0.0–0.7)
Eosinophils Relative: 0.9 % (ref 0.0–5.0)
HCT: 39.9 % (ref 36.0–46.0)
Hemoglobin: 13 g/dL (ref 12.0–15.0)
Lymphocytes Relative: 28 % (ref 12.0–46.0)
Lymphs Abs: 1.2 10*3/uL (ref 0.7–4.0)
MCHC: 32.7 g/dL (ref 30.0–36.0)
MCV: 87.3 fl (ref 78.0–100.0)
Monocytes Absolute: 0.3 10*3/uL (ref 0.1–1.0)
Monocytes Relative: 8.1 % (ref 3.0–12.0)
Neutro Abs: 2.6 10*3/uL (ref 1.4–7.7)
Neutrophils Relative %: 62.3 % (ref 43.0–77.0)
Platelets: 188 10*3/uL (ref 150.0–400.0)
RBC: 4.57 Mil/uL (ref 3.87–5.11)
RDW: 15.2 % (ref 11.5–15.5)
WBC: 4.1 10*3/uL (ref 4.0–10.5)

## 2015-11-06 LAB — VITAMIN D 25 HYDROXY (VIT D DEFICIENCY, FRACTURES): VITD: 42.03 ng/mL (ref 30.00–100.00)

## 2015-11-06 LAB — LIPID PANEL
Cholesterol: 238 mg/dL — ABNORMAL HIGH (ref 0–200)
HDL: 78.5 mg/dL (ref >39.00)
LDL Cholesterol: 142 mg/dL — ABNORMAL HIGH (ref 0–99)
NonHDL: 159.61
Total CHOL/HDL Ratio: 3
Triglycerides: 86 mg/dL (ref 0.0–149.0)
VLDL: 17.2 mg/dL (ref 0.0–40.0)

## 2015-11-13 ENCOUNTER — Encounter: Payer: Self-pay | Admitting: Family Medicine

## 2015-11-13 ENCOUNTER — Ambulatory Visit (INDEPENDENT_AMBULATORY_CARE_PROVIDER_SITE_OTHER): Payer: Medicare Other | Admitting: Family Medicine

## 2015-11-13 VITALS — BP 106/64 | HR 67 | Temp 98.0°F | Ht 63.25 in | Wt 137.8 lb

## 2015-11-13 DIAGNOSIS — E559 Vitamin D deficiency, unspecified: Secondary | ICD-10-CM

## 2015-11-13 DIAGNOSIS — Z0000 Encounter for general adult medical examination without abnormal findings: Secondary | ICD-10-CM

## 2015-11-13 DIAGNOSIS — M8580 Other specified disorders of bone density and structure, unspecified site: Secondary | ICD-10-CM | POA: Diagnosis not present

## 2015-11-13 DIAGNOSIS — E785 Hyperlipidemia, unspecified: Secondary | ICD-10-CM | POA: Diagnosis not present

## 2015-11-13 NOTE — Assessment & Plan Note (Signed)
Reviewed health habits including diet and exercise and skin cancer prevention Reviewed appropriate screening tests for age  Also reviewed health mt list, fam hx and immunization status , as well as social and family history   See HPI Labs reviewed If you are interested in a shingles/zoster vaccine - call your insurance to check on coverage,( you should not get it within 1 month of other vaccines) , then call us for a prescription  for it to take to a pharmacy that gives the shot , or make a nurse visit to get it here depending on your coverage   For cholesterol  Avoid red meat/ fried foods/ egg yolks/ fatty breakfast meats/ butter, cheese and high fat dairy/ and shellfish   Take care of yourself  Stay active

## 2015-11-13 NOTE — Assessment & Plan Note (Signed)
Level now in the 40s Will continue 5000 iu daily  Disc imp to bone and overall health

## 2015-11-13 NOTE — Progress Notes (Signed)
Pre visit review using our clinic review tool, if applicable. No additional management support is needed unless otherwise documented below in the visit note. 

## 2015-11-13 NOTE — Patient Instructions (Signed)
If you are interested in a shingles/zoster vaccine - call your insurance to check on coverage,( you should not get it within 1 month of other vaccines) , then call us for a prescription  for it to take to a pharmacy that gives the shot , or make a nurse visit to get it here depending on your coverage   For cholesterol  Avoid red meat/ fried foods/ egg yolks/ fatty breakfast meats/ butter, cheese and high fat dairy/ and shellfish   Take care of yourself  Stay active

## 2015-11-13 NOTE — Assessment & Plan Note (Signed)
Reviewed dexa 1/16 with stable osteopenia No falls or fractures  Disc need for calcium/ vitamin D/ wt bearing exercise and bone density test every 2 y to monitor Disc safety/ fracture risk in detail

## 2015-11-13 NOTE — Progress Notes (Signed)
Subjective:    Patient ID: Melissa Blackwell, female    DOB: 10-08-1946, 69 y.o.   MRN: [id number]  HPI Here for annual medicare wellness visit as well as chronic/acute medical problems   I have personally reviewed the Medicare Annual Wellness questionnaire and have noted 1. The patient's medical and social history 2. Their use of alcohol, tobacco or illicit drugs 3. Their current medications and supplements 4. The patient's functional ability including ADL's, fall risks, home safety risks and hearing or visual             impairment. 5. Diet and physical activities 6. Evidence for depression or mood disorders  The patients weight, height, BMI have been recorded in the chart and visual acuity is per eye clinic.  I have made referrals, counseling and provided education to the patient based review of the above and I have provided the pt with a written personalized care plan for preventive services. Reviewed and updated provider list, see scanned forms.  In general doing pretty well    See scanned forms.  Routine anticipatory guidance given to patient.  See health maintenance. Colon cancer screening colonoscopy 10/11 - recall in 10 years  Breast cancer screening 1/16 nl  Self breast exam- no lumps (does not always check) Has had a hysterectomy in the past  Flu vaccine- declines  Tetanus vaccine 11/14  Pneumovax - has not had / declines them  Zoster vaccine- is thinking about (declined in the past)  dexa 1/16 fairly stable osteopenia , no falls or fractures, does exercise , she takes ca and D (5000) Advance directive has a living will and power of attorney  Cognitive function addressed- see scanned forms- and if abnormal then additional documentation follows.  No concerns (her concentration was worse with lyme and now doing better)  PMH and SH reviewed  Meds, vitals, and allergies reviewed.   ROS: See HPI.  Otherwise negative.    Vit D is 42.3 Hx of def in the past    Cholesterol Lab Results  Component Value Date   CHOL 238* 11/06/2015   CHOL 225* 10/24/2014   CHOL 228* 10/14/2013   Lab Results  Component Value Date   HDL 78.50 11/06/2015   HDL 68.90 10/24/2014   HDL 90.30 10/14/2013   Lab Results  Component Value Date   LDLCALC 142* 11/06/2015   LDLCALC 142* 10/24/2014   Lab Results  Component Value Date   TRIG 86.0 11/06/2015   TRIG 69.0 10/24/2014   TRIG 79.0 10/14/2013   Lab Results  Component Value Date   CHOLHDL 3 11/06/2015   CHOLHDL 3 10/24/2014   CHOLHDL 3 10/14/2013   Lab Results  Component Value Date   LDLDIRECT 124.9 10/14/2013   LDLDIRECT 148.5 10/07/2012   LDLDIRECT 129.0 10/10/2011    HDL is up 10 points  LDL is still mildly high at 142  Diet is good overall  Did eat a lot of shellfish in OCT  She likes cheese  Does not eat ice cream  Limits sausage and bacon   Results for orders placed or performed in visit on 11/06/15  CBC with Differential/Platelet  Result Value Ref Range   WBC 4.1 4.0 - 10.5 K/uL   RBC 4.57 3.87 - 5.11 Mil/uL   Hemoglobin 13.0 12.0 - 15.0 g/dL   HCT 39.9 36.0 - 46.0 %   MCV 87.3 78.0 - 100.0 fl   MCHC 32.7 30.0 - 36.0 g/dL   RDW 15.2 11.5 - 15.5 %  Platelets 188.0 150.0 - 400.0 K/uL   Neutrophils Relative % 62.3 43.0 - 77.0 %   Lymphocytes Relative 28.0 12.0 - 46.0 %   Monocytes Relative 8.1 3.0 - 12.0 %   Eosinophils Relative 0.9 0.0 - 5.0 %   Basophils Relative 0.7 0.0 - 3.0 %   Neutro Abs 2.6 1.4 - 7.7 K/uL   Lymphs Abs 1.2 0.7 - 4.0 K/uL   Monocytes Absolute 0.3 0.1 - 1.0 K/uL   Eosinophils Absolute 0.0 0.0 - 0.7 K/uL   Basophils Absolute 0.0 0.0 - 0.1 K/uL  Comprehensive metabolic panel  Result Value Ref Range   Sodium 142 135 - 145 mEq/L   Potassium 4.8 3.5 - 5.1 mEq/L   Chloride 105 96 - 112 mEq/L   CO2 30 19 - 32 mEq/L   Glucose, Bld 91 70 - 99 mg/dL   BUN 17 6 - 23 mg/dL   Creatinine, Ser 0.73 0.40 - 1.20 mg/dL   Total Bilirubin 0.5 0.2 - 1.2 mg/dL    Alkaline Phosphatase 71 39 - 117 U/L   AST 16 0 - 37 U/L   ALT 13 0 - 35 U/L   Total Protein 7.0 6.0 - 8.3 g/dL   Albumin 4.4 3.5 - 5.2 g/dL   Calcium 9.9 8.4 - 10.5 mg/dL   GFR 83.60 >60.00 mL/min  Lipid panel  Result Value Ref Range   Cholesterol 238 (H) 0 - 200 mg/dL   Triglycerides 86.0 0.0 - 149.0 mg/dL   HDL 78.50 >39.00 mg/dL   VLDL 17.2 0.0 - 40.0 mg/dL   LDL Cholesterol 142 (H) 0 - 99 mg/dL   Total CHOL/HDL Ratio 3    NonHDL 159.61   TSH  Result Value Ref Range   TSH 1.85 0.35 - 4.50 uIU/mL  VITAMIN D 25 Hydroxy (Vit-D Deficiency, Fractures)  Result Value Ref Range   VITD 42.03 30.00 - 100.00 ng/mL     Patient Active Problem List   Diagnosis Date Noted  . Fatigue 11/05/2015  . Vitamin D deficiency 10/31/2014  . Hyperlipidemia 10/31/2014  . Left groin pain 05/04/2014  . Hyperkalemia 10/27/2013  . Encounter for Medicare annual wellness exam 10/13/2013  . Other screening mammogram 10/15/2011  . DIVERTICULOSIS, COLON 08/23/2010  . COLONIC POLYPS, ADENOMATOUS, HX OF 08/23/2010  . UNSPEC SYMPTOM ASSOC W/FEMALE GENITAL ORGANS 08/11/2009  . LYME DISEASE 08/10/2007  . HEMORRHOIDS 07/21/2007  . CONSTIPATION, CHRONIC 07/21/2007  . Osteopenia 07/21/2007  . RHEUMATOID FACTOR, POSITIVE 07/21/2007  . SKIN CANCER, HX OF 07/21/2007   Past Medical History  Diagnosis Date  . Cancer ([place])     skin CA Hx of squamous cell in situ  . Hyperlipidemia   . Osteopenia   . Lyme disease 06/2007    Dr Knox Royalty  . Hx of adenomatous colonic polyps   . GI bleed   . Internal hemorrhoids   . Diverticulosis of sigmoid colon     mild  . Renal cyst     right renal cyst   Past Surgical History  Procedure Laterality Date  . Hemorroidectomy  04/2001  . Abdominal hysterectomy  03/1996    partial fibroids  . Scar removal      keloid  scar  . Spine surgery      spinal fusion  . Rhinoplasty    . Hemorrhoid banding     Social History  Substance Use Topics  . Smoking status: Never  Smoker   . Smokeless tobacco: None  . Alcohol Use: 0.0 oz/week  0 Standard drinks or equivalent per week     Comment: 1 glass of wine a week- occ   Family History  Problem Relation Age of Onset  . Cancer Mother     pancreatic CA  . Heart disease Mother   . Heart disease Father     CHF and heart problems  . Hyperlipidemia Sister   . Cancer Sister     uterine CA  . Heart disease Maternal Grandmother    No Known Allergies Current Outpatient Prescriptions on File Prior to Visit  Medication Sig Dispense Refill  . Acetylcysteine (N-ACETYL-L-CYSTEINE) 600 MG CAPS Take 1 tablet by mouth daily.    . calcium carbonate (OS-CAL) 600 MG TABS Take 600 mg by mouth 2 (two) times daily with a meal.      . KRILL OIL PO Take 1 capsule by mouth daily.      . NON FORMULARY Cowden herbal protocol for lyme disease as directed     . NON FORMULARY 500 mg 2 (two) times daily. A-H-C-C (IMMUNE SYSTEM SUPPORT)    . NON FORMULARY Take 1 tablet by mouth daily. BACOPA    . PHOSPHATIDYL CHOLINE PO Take 420 mg by mouth daily.    . Probiotic Product (PROBIOTIC PO) Take 1 capsule by mouth daily.      . Red Yeast Rice Extract 600 MG CAPS Take 1 capsule by mouth 2 (two) times daily.      Marland Kitchen Resveratrol 250 MG CAPS Take 1 capsule by mouth daily.      No current facility-administered medications on file prior to visit.    Review of Systems Review of Systems  Constitutional: Negative for fever, appetite change, fatigue and unexpected weight change.  Eyes: Negative for pain and visual disturbance.  Respiratory: Negative for cough and shortness of breath.   Cardiovascular: Negative for cp or palpitations    Gastrointestinal: Negative for nausea, diarrhea and constipation.  Genitourinary: Negative for urgency and frequency.  Skin: Negative for pallor or rash   Neurological: Negative for weakness, light-headedness, numbness and headaches.  Hematological: Negative for adenopathy. Does not bruise/bleed easily.   Psychiatric/Behavioral: Negative for dysphoric mood. The patient is not nervous/anxious.         Objective:   Physical Exam  Constitutional: She appears well-developed and well-nourished. No distress.  Well appearing   HENT:  Head: Normocephalic and atraumatic.  Right Ear: External ear normal.  Left Ear: External ear normal.  Mouth/Throat: Oropharynx is clear and moist.  Eyes: Conjunctivae and EOM are normal. Pupils are equal, round, and reactive to light. No scleral icterus.  Neck: Normal range of motion. Neck supple. No JVD present. Carotid bruit is not present. No thyromegaly present.  Cardiovascular: Normal rate, regular rhythm, normal heart sounds and intact distal pulses.  Exam reveals no gallop.   Pulmonary/Chest: Effort normal and breath sounds normal. No respiratory distress. She has no wheezes. She exhibits no tenderness.  Abdominal: Soft. Bowel sounds are normal. She exhibits no distension, no abdominal bruit and no mass. There is no tenderness.  Genitourinary: No breast swelling, tenderness, discharge or bleeding.  Breast exam: No mass, nodules, thickening, tenderness, bulging, retraction, inflamation, nipple discharge or skin changes noted.  No axillary or clavicular LA.      Musculoskeletal: Normal range of motion. She exhibits no edema or tenderness.  Lymphadenopathy:    She has no cervical adenopathy.  Neurological: She is alert. She has normal reflexes. No cranial nerve deficit. She exhibits normal muscle tone. Coordination normal.  Skin: Skin is warm and dry. No rash noted. No erythema. No pallor.  Lentigo diffusely with some solar aging  Few SKs   Psychiatric: She has a normal mood and affect.          Assessment & Plan:   Problem List Items Addressed This Visit      Musculoskeletal and Integument   Osteopenia - Primary    Reviewed dexa 1/16 with stable osteopenia No falls or fractures  Disc need for calcium/ vitamin D/ wt bearing exercise and bone  density test every 2 y to monitor Disc safety/ fracture risk in detail          Other   Encounter for Medicare annual wellness exam    Reviewed health habits including diet and exercise and skin cancer prevention Reviewed appropriate screening tests for age  Also reviewed health mt list, fam hx and immunization status , as well as social and family history   See HPI Labs reviewed If you are interested in a shingles/zoster vaccine - call your insurance to check on coverage,( you should not get it within 1 month of other vaccines) , then call us for a prescription  for it to take to a pharmacy that gives the shot , or make a nurse visit to get it here depending on your coverage   For cholesterol  Avoid red meat/ fried foods/ egg yolks/ fatty breakfast meats/ butter, cheese and high fat dairy/ and shellfish   Take care of yourself  Stay active       Hyperlipidemia    Disc goals for lipids and reasons to control them Rev labs with pt Rev low sat fat diet in detail LDL is stable in 140s   Given info on low fat diet - does well most of the time       Vitamin D deficiency    Level now in the 40s Will continue 5000 iu daily  Disc imp to bone and overall health

## 2015-11-13 NOTE — Assessment & Plan Note (Signed)
Disc goals for lipids and reasons to control them Rev labs with pt Rev low sat fat diet in detail LDL is stable in 140s   Given info on low fat diet - does well most of the time

## 2015-12-21 ENCOUNTER — Encounter: Payer: Self-pay | Admitting: *Deleted

## 2016-01-17 DIAGNOSIS — D1801 Hemangioma of skin and subcutaneous tissue: Secondary | ICD-10-CM | POA: Diagnosis not present

## 2016-01-17 DIAGNOSIS — Z85828 Personal history of other malignant neoplasm of skin: Secondary | ICD-10-CM | POA: Diagnosis not present

## 2016-01-17 DIAGNOSIS — B009 Herpesviral infection, unspecified: Secondary | ICD-10-CM | POA: Diagnosis not present

## 2016-01-17 DIAGNOSIS — L905 Scar conditions and fibrosis of skin: Secondary | ICD-10-CM | POA: Diagnosis not present

## 2016-01-17 DIAGNOSIS — L821 Other seborrheic keratosis: Secondary | ICD-10-CM | POA: Diagnosis not present

## 2016-01-17 DIAGNOSIS — L820 Inflamed seborrheic keratosis: Secondary | ICD-10-CM | POA: Diagnosis not present

## 2016-02-05 ENCOUNTER — Other Ambulatory Visit: Payer: Self-pay

## 2016-02-05 DIAGNOSIS — Z1231 Encounter for screening mammogram for malignant neoplasm of breast: Secondary | ICD-10-CM

## 2016-02-06 DIAGNOSIS — M2550 Pain in unspecified joint: Secondary | ICD-10-CM | POA: Diagnosis not present

## 2016-02-06 DIAGNOSIS — R5383 Other fatigue: Secondary | ICD-10-CM | POA: Diagnosis not present

## 2016-02-06 DIAGNOSIS — G909 Disorder of the autonomic nervous system, unspecified: Secondary | ICD-10-CM | POA: Diagnosis not present

## 2016-02-06 DIAGNOSIS — M549 Dorsalgia, unspecified: Secondary | ICD-10-CM | POA: Diagnosis not present

## 2016-02-06 DIAGNOSIS — R5381 Other malaise: Secondary | ICD-10-CM | POA: Diagnosis not present

## 2016-02-26 ENCOUNTER — Ambulatory Visit
Admission: RE | Admit: 2016-02-26 | Discharge: 2016-02-26 | Disposition: A | Discharge status: Home / Self Care | Payer: Medicare Other | Source: Ambulatory Visit

## 2016-02-26 DIAGNOSIS — Z1231 Encounter for screening mammogram for malignant neoplasm of breast: Secondary | ICD-10-CM | POA: Diagnosis not present

## 2016-02-26 LAB — HM MAMMOGRAPHY: HM Mammogram: NORMAL

## 2016-02-27 ENCOUNTER — Encounter: Payer: Self-pay | Admitting: *Deleted

## 2016-07-11 DIAGNOSIS — K219 Gastro-esophageal reflux disease without esophagitis: Secondary | ICD-10-CM | POA: Diagnosis not present

## 2016-07-11 DIAGNOSIS — H6123 Impacted cerumen, bilateral: Secondary | ICD-10-CM | POA: Diagnosis not present

## 2016-07-23 DIAGNOSIS — H40013 Open angle with borderline findings, low risk, bilateral: Secondary | ICD-10-CM | POA: Diagnosis not present

## 2016-07-23 DIAGNOSIS — H2513 Age-related nuclear cataract, bilateral: Secondary | ICD-10-CM | POA: Diagnosis not present

## 2016-08-27 DIAGNOSIS — R5381 Other malaise: Secondary | ICD-10-CM | POA: Diagnosis not present

## 2016-08-27 DIAGNOSIS — M549 Dorsalgia, unspecified: Secondary | ICD-10-CM | POA: Diagnosis not present

## 2016-08-27 DIAGNOSIS — G909 Disorder of the autonomic nervous system, unspecified: Secondary | ICD-10-CM | POA: Diagnosis not present

## 2016-08-27 DIAGNOSIS — R5383 Other fatigue: Secondary | ICD-10-CM | POA: Diagnosis not present

## 2016-08-27 DIAGNOSIS — M2550 Pain in unspecified joint: Secondary | ICD-10-CM | POA: Diagnosis not present

## 2016-10-30 DIAGNOSIS — L820 Inflamed seborrheic keratosis: Secondary | ICD-10-CM | POA: Diagnosis not present

## 2016-10-30 DIAGNOSIS — D1801 Hemangioma of skin and subcutaneous tissue: Secondary | ICD-10-CM | POA: Diagnosis not present

## 2016-10-30 DIAGNOSIS — Z85828 Personal history of other malignant neoplasm of skin: Secondary | ICD-10-CM | POA: Diagnosis not present

## 2016-10-30 DIAGNOSIS — L821 Other seborrheic keratosis: Secondary | ICD-10-CM | POA: Diagnosis not present

## 2016-10-30 DIAGNOSIS — B009 Herpesviral infection, unspecified: Secondary | ICD-10-CM | POA: Diagnosis not present

## 2016-10-30 DIAGNOSIS — L905 Scar conditions and fibrosis of skin: Secondary | ICD-10-CM | POA: Diagnosis not present

## 2016-11-05 DIAGNOSIS — H40013 Open angle with borderline findings, low risk, bilateral: Secondary | ICD-10-CM | POA: Diagnosis not present

## 2016-11-11 ENCOUNTER — Telehealth: Payer: Self-pay | Admitting: Family Medicine

## 2016-11-11 DIAGNOSIS — E875 Hyperkalemia: Secondary | ICD-10-CM

## 2016-11-11 DIAGNOSIS — E7800 Pure hypercholesterolemia, unspecified: Secondary | ICD-10-CM

## 2016-11-11 DIAGNOSIS — E559 Vitamin D deficiency, unspecified: Secondary | ICD-10-CM

## 2016-11-11 NOTE — Telephone Encounter (Signed)
Annual labs

## 2016-11-12 ENCOUNTER — Ambulatory Visit (INDEPENDENT_AMBULATORY_CARE_PROVIDER_SITE_OTHER): Payer: Medicare Other

## 2016-11-12 VITALS — BP 102/70 | HR 65 | Temp 97.6°F | Ht 63.0 in | Wt 134.8 lb

## 2016-11-12 DIAGNOSIS — Z0000 Encounter for general adult medical examination without abnormal findings: Secondary | ICD-10-CM | POA: Diagnosis not present

## 2016-11-12 DIAGNOSIS — E7800 Pure hypercholesterolemia, unspecified: Secondary | ICD-10-CM | POA: Diagnosis not present

## 2016-11-12 DIAGNOSIS — Z1159 Encounter for screening for other viral diseases: Secondary | ICD-10-CM | POA: Diagnosis not present

## 2016-11-12 DIAGNOSIS — E875 Hyperkalemia: Secondary | ICD-10-CM | POA: Diagnosis not present

## 2016-11-12 DIAGNOSIS — E559 Vitamin D deficiency, unspecified: Secondary | ICD-10-CM | POA: Diagnosis not present

## 2016-11-12 LAB — COMPREHENSIVE METABOLIC PANEL
ALT: 23 U/L (ref 0–35)
AST: 24 U/L (ref 0–37)
Albumin: 4.7 g/dL (ref 3.5–5.2)
Alkaline Phosphatase: 78 U/L (ref 39–117)
BUN: 13 mg/dL (ref 6–23)
CO2: 32 mEq/L (ref 19–32)
Calcium: 10.1 mg/dL (ref 8.4–10.5)
Chloride: 103 mEq/L (ref 96–112)
Creatinine, Ser: 0.79 mg/dL (ref 0.40–1.20)
GFR: 76.1 mL/min (ref >60.00)
Glucose, Bld: 98 mg/dL (ref 70–99)
Potassium: 5.1 mEq/L (ref 3.5–5.1)
Sodium: 141 mEq/L (ref 135–145)
Total Bilirubin: 0.5 mg/dL (ref 0.2–1.2)
Total Protein: 7.4 g/dL (ref 6.0–8.3)

## 2016-11-12 LAB — LIPID PANEL
Cholesterol: 255 mg/dL — ABNORMAL HIGH (ref 0–200)
HDL: 82.7 mg/dL (ref >39.00)
LDL Cholesterol: 156 mg/dL — ABNORMAL HIGH (ref 0–99)
NonHDL: 172.42
Total CHOL/HDL Ratio: 3
Triglycerides: 81 mg/dL (ref 0.0–149.0)
VLDL: 16.2 mg/dL (ref 0.0–40.0)

## 2016-11-12 LAB — CBC WITH DIFFERENTIAL/PLATELET
Basophils Absolute: 0 10*3/uL (ref 0.0–0.1)
Basophils Relative: 0.6 % (ref 0.0–3.0)
Eosinophils Absolute: 0 10*3/uL (ref 0.0–0.7)
Eosinophils Relative: 0.6 % (ref 0.0–5.0)
HCT: 40.5 % (ref 36.0–46.0)
Hemoglobin: 13.6 g/dL (ref 12.0–15.0)
Lymphocytes Relative: 25 % (ref 12.0–46.0)
Lymphs Abs: 1.3 10*3/uL (ref 0.7–4.0)
MCHC: 33.5 g/dL (ref 30.0–36.0)
MCV: 86.1 fl (ref 78.0–100.0)
Monocytes Absolute: 0.4 10*3/uL (ref 0.1–1.0)
Monocytes Relative: 7.1 % (ref 3.0–12.0)
Neutro Abs: 3.5 10*3/uL (ref 1.4–7.7)
Neutrophils Relative %: 66.7 % (ref 43.0–77.0)
Platelets: 212 10*3/uL (ref 150.0–400.0)
RBC: 4.71 Mil/uL (ref 3.87–5.11)
RDW: 14.4 % (ref 11.5–15.5)
WBC: 5.3 10*3/uL (ref 4.0–10.5)

## 2016-11-12 LAB — TSH: TSH: 1.9 u[IU]/mL (ref 0.35–4.50)

## 2016-11-12 LAB — VITAMIN D 25 HYDROXY (VIT D DEFICIENCY, FRACTURES): VITD: 46.84 ng/mL (ref 30.00–100.00)

## 2016-11-12 NOTE — Progress Notes (Signed)
Pre visit review using our clinic review tool, if applicable. No additional management support is needed unless otherwise documented below in the visit note. 

## 2016-11-12 NOTE — Progress Notes (Signed)
Subjective:   Melissa Blackwell is a 70 y.o. female who presents for Medicare Annual (Subsequent) preventive examination.  Review of Systems:  N/A Cardiac Risk Factors include: advanced age (>20men, >71 women);dyslipidemia     Objective:     Vitals: BP 102/70 (BP Location: Left Arm, Patient Position: Sitting, Cuff Size: Normal)   Pulse 65   Temp 97.6 F (36.4 C) (Oral)   Ht 5\' 3"  (1.6 m) Comment: no shoes  Wt 134 lb 12 oz (61.1 kg)   SpO2 97%   BMI 23.87 kg/m   Body mass index is 23.87 kg/m.   Tobacco History  Smoking Status  . Never Smoker  Smokeless Tobacco  . Never Used     Counseling given: No   Past Medical History:  Diagnosis Date  . Cancer ([place])    skin CA Hx of squamous cell in situ  . Diverticulosis of sigmoid colon    mild  . GI bleed   . Hx of adenomatous colonic polyps   . Hyperlipidemia   . Internal hemorrhoids   . Lyme disease 06/2007   Dr Knox Royalty  . Osteopenia   . Renal cyst    right renal cyst   Past Surgical History:  Procedure Laterality Date  . ABDOMINAL HYSTERECTOMY  03/1996   partial fibroids  . hemorrhoid banding    . HEMORROIDECTOMY  04/2001  . RHINOPLASTY    . scar removal     keloid  scar  . SPINE SURGERY     spinal fusion   Family History  Problem Relation Age of Onset  . Cancer Mother     pancreatic CA  . Heart disease Mother   . Heart disease Father     CHF and heart problems  . Hyperlipidemia Sister   . Cancer Sister     uterine CA  . Heart disease Maternal Grandmother    History  Sexual Activity  . Sexual activity: Not on file    Outpatient Encounter Prescriptions as of 11/12/2016  Medication Sig  . Acetylcysteine (N-ACETYL-L-CYSTEINE) 600 MG CAPS Take 1 tablet by mouth daily.  . calcium carbonate (OS-CAL) 600 MG TABS Take 600 mg by mouth 2 (two) times daily with a meal.    . Chlorophyllin Copper Cmplx Sod POWD Take 1 capsule by mouth daily.   . Cholecalciferol (VITAMIN D3) 5000 UNITS CAPS Take 5,000  Units by mouth daily.  Marland Kitchen KRILL OIL PO Take 1 capsule by mouth daily.    . NON FORMULARY Cowden herbal protocol for lyme disease as directed   . PHOSPHATIDYL CHOLINE PO Take 420 mg by mouth daily.  . Probiotic Product (PROBIOTIC PO) Take 1 capsule by mouth daily.    . Red Yeast Rice Extract 600 MG CAPS Take 1 capsule by mouth 2 (two) times daily.    Marland Kitchen Resveratrol 250 MG CAPS Take 1 capsule by mouth daily.   . [DISCONTINUED] NON FORMULARY 500 mg 2 (two) times daily. A-H-C-C (IMMUNE SYSTEM SUPPORT)  . [DISCONTINUED] NON FORMULARY Take 1 tablet by mouth daily. BACOPA   No facility-administered encounter medications on file as of 11/12/2016.     Activities of Daily Living In your present state of health, do you have any difficulty performing the following activities: 11/12/2016  Hearing? Y  Vision? N  Difficulty concentrating or making decisions? N  Walking or climbing stairs? N  Dressing or bathing? N  Doing errands, shopping? N  Preparing Food and eating ? N  Using the Toilet? N  In the past six months, have you accidently leaked urine? N  Do you have problems with loss of bowel control? N  Managing your Medications? N  Managing your Finances? N  Housekeeping or managing your Housekeeping? N  Some recent data might be hidden    Patient Care Team: Abner Greenspan, MD as PCP - General    Assessment:     Hearing Screening   125Hz  250Hz  500Hz  1000Hz  2000Hz  3000Hz  4000Hz  6000Hz  8000Hz   Right ear:   40 40 40  0    Left ear:   40 40 40  0    Vision Screening Comments: 11/05/16 Last vision exam with Dr. Philbert Riser   Exercise Activities and Dietary recommendations Current Exercise Habits: Home exercise routine, Type of exercise: strength training/weights;calisthenics, Time (Minutes): 50, Frequency (Times/Week): 3, Weekly Exercise (Minutes/Week): 150, Intensity: Moderate, Exercise limited by: None identified  Goals    . Increase physical activity          Starting 11/12/2016, I will  continue to exercise at least 50 min 2-3 days per week.       Fall Risk Fall Risk  11/12/2016 11/13/2015 10/31/2014 10/27/2013  Falls in the past year? No No No No   Depression Screen PHQ 2/9 Scores 11/12/2016 11/13/2015 10/31/2014 10/27/2013  PHQ - 2 Score 0 0 0 0     Cognitive Function MMSE - Mini Mental State Exam 11/12/2016  Orientation to time 5  Orientation to Place 5  Registration 3  Attention/ Calculation 0  Recall 3  Language- name 2 objects 0  Language- repeat 1  Language- follow 3 step command 3  Language- read & follow direction 0  Write a sentence 0  Copy design 0  Total score 20     PLEASE NOTE: A Mini-Cog screen was completed. Maximum score is 20. A value of 0 denotes this part of Folstein MMSE was not completed or the patient failed this part of the Mini-Cog screening.   Mini-Cog Screening Orientation to Time - Max 5 pts Orientation to Place - Max 5 pts Registration - Max 3 pts Recall - Max 3 pts Language Repeat - Max 1 pts Language Follow 3 Step Command - Max 3 pts     Immunization History  Administered Date(s) Administered  . Td 03/29/2003, 10/27/2013   Screening Tests Health Maintenance  Topic Date Due  . INFLUENZA VACCINE  01/24/2021 (Originally 07/23/2016)  . PNA vac Low Risk Adult (1 of 2 - PCV13) 01/24/2021 (Originally 02/28/2010)  . MAMMOGRAM  02/25/2017  . COLONOSCOPY  10/05/2020  . TETANUS/TDAP  10/28/2023  . DEXA SCAN  Completed  . ZOSTAVAX  Addressed  . Hepatitis C Screening  Completed      Plan:     I have personally reviewed and addressed the Medicare Annual Wellness questionnaire and have noted the following in the patient's chart:  A. Medical and social history B. Use of alcohol, tobacco or illicit drugs  C. Current medications and supplements D. Functional ability and status E.  Nutritional status F.  Physical activity G. Advance directives H. List of other physicians I.  Hospitalizations, surgeries, and ER visits in  previous 12 months J.  [place] to include hearing, vision, cognitive, depression L. Referrals and appointments - none  In addition, I have reviewed and discussed with patient certain preventive protocols, quality metrics, and best practice recommendations. A written personalized care plan for preventive services as well as general preventive health recommendations were provided to patient.  See attached scanned questionnaire for additional information.   Signed,   Lindell Noe, MHA, BS, LPN Health Coach

## 2016-11-12 NOTE — Patient Instructions (Addendum)
Ms. Safer , Thank you for taking time to come for your Medicare Wellness Visit. I appreciate your ongoing commitment to your health goals. Please review the following plan we discussed and let me know if I can assist you in the future.   These are the goals we discussed: Goals    . Increase physical activity          Starting 11/12/2016, I will continue to exercise at least 50 min 2-3 days per week.        This is a list of the screening recommended for you and due dates:  Health Maintenance  Topic Date Due  .  Hepatitis C: One time screening is recommended by Center for Disease Control  (CDC) for  adults born from [birth year] through 1966.   Completed  . Flu Shot  01/24/2021*  . Pneumonia vaccines (1 of 2 - PCV13) 01/24/2021*  . Mammogram  02/25/2017  . Colon Cancer Screening  10/05/2020  . Tetanus Vaccine  10/28/2023  . DEXA scan (bone density measurement)  Completed  . Shingles Vaccine  Addressed  *Topic was postponed. The date shown is not the original due date.   Preventive Care for Adults  A healthy lifestyle and preventive care can promote health and wellness. Preventive health guidelines for adults include the following key practices.  . A routine yearly physical is a good way to check with your health care provider about your health and preventive screening. It is a chance to share any concerns and updates on your health and to receive a thorough exam.  . Visit your dentist for a routine exam and preventive care every 6 months. Brush your teeth twice a day and floss once a day. Good oral hygiene prevents tooth decay and gum disease.  . The frequency of eye exams is based on your age, health, family medical history, use  of contact lenses, and other factors. Follow your health care provider's ecommendations for frequency of eye exams.  . Eat a healthy diet. Foods like vegetables, fruits, whole grains, low-fat dairy products, and lean protein foods contain the nutrients you need  without too many calories. Decrease your intake of foods high in solid fats, added sugars, and salt. Eat the right amount of calories for you. Get information about a proper diet from your health care provider, if necessary.  . Regular physical exercise is one of the most important things you can do for your health. Most adults should get at least 150 minutes of moderate-intensity exercise (any activity that increases your heart rate and causes you to sweat) each week. In addition, most adults need muscle-strengthening exercises on 2 or more days a week.  Silver Sneakers may be a benefit available to you. To determine eligibility, you may visit the website: www.silversneakers.com or contact program at [phone] Mon-Fri between 8AM-8PM.   . Maintain a healthy weight. The body mass index (BMI) is a screening tool to identify possible weight problems. It provides an estimate of body fat based on height and weight. Your health care provider can find your BMI and can help you achieve or maintain a healthy weight.   For adults 20 years and older: ? A BMI below 18.5 is considered underweight. ? A BMI of 18.5 to 24.9 is normal. ? A BMI of 25 to 29.9 is considered overweight. ? A BMI of 30 and above is considered obese.   . Maintain normal blood lipids and cholesterol levels by exercising and minimizing your intake of  saturated fat. Eat a balanced diet with plenty of fruit and vegetables. Blood tests for lipids and cholesterol should begin at age 26 and be repeated every 5 years. If your lipid or cholesterol levels are high, you are over 50, or you are at high risk for heart disease, you [place] need your cholesterol levels checked more frequently. Ongoing high lipid and cholesterol levels should be treated with medicines if diet and exercise are not working.  . If you smoke, find out from your health care provider how to quit. If you do not use tobacco, please do not start.  . If you choose to drink  alcohol, please do not consume more than 2 drinks per day. One drink is considered to be 12 ounces (355 mL) of beer, 5 ounces (148 mL) of wine, or 1.5 ounces (44 mL) of liquor.  . If you are 78-88 years old, ask your health care provider if you should take aspirin to prevent strokes.  . Use sunscreen. Apply sunscreen liberally and repeatedly throughout the day. You should seek shade when your shadow is shorter than you. Protect yourself by wearing long sleeves, pants, a wide-brimmed hat, and sunglasses year round, whenever you are outdoors.  . Once a month, do a whole body skin exam, using a mirror to look at the skin on your back. Tell your health care provider of new moles, moles that have irregular borders, moles that are larger than a pencil eraser, or moles that have changed in shape or color.

## 2016-11-12 NOTE — Progress Notes (Signed)
PCP notes:   Health maintenance:  Hep C screening - completed  Abnormal screenings:   Hearing - failed  Patient concerns:   Pt wants to discuss concerns with rectal bleeding and chest pain related to lyme disease. Pt would like an EKG completed at next appt, if possible.   Nurse concerns:  None  Next PCP appt:   11/20/16 @ 1400  I reviewed health advisor's note, was available for consultation, and agree with documentation and plan. Loura Pardon MD

## 2016-11-13 ENCOUNTER — Ambulatory Visit: Payer: Medicare Other

## 2016-11-13 LAB — HEPATITIS C ANTIBODY: HCV Ab: NEGATIVE

## 2016-11-20 ENCOUNTER — Other Ambulatory Visit: Payer: Self-pay | Admitting: Family Medicine

## 2016-11-20 ENCOUNTER — Encounter: Payer: Self-pay | Admitting: Family Medicine

## 2016-11-20 ENCOUNTER — Ambulatory Visit (INDEPENDENT_AMBULATORY_CARE_PROVIDER_SITE_OTHER): Payer: Medicare Other | Admitting: Family Medicine

## 2016-11-20 VITALS — BP 122/64 | HR 73 | Temp 98.0°F | Ht 63.0 in | Wt 136.5 lb

## 2016-11-20 DIAGNOSIS — M8589 Other specified disorders of bone density and structure, multiple sites: Secondary | ICD-10-CM

## 2016-11-20 DIAGNOSIS — K649 Unspecified hemorrhoids: Secondary | ICD-10-CM

## 2016-11-20 DIAGNOSIS — E7800 Pure hypercholesterolemia, unspecified: Secondary | ICD-10-CM

## 2016-11-20 DIAGNOSIS — R0789 Other chest pain: Secondary | ICD-10-CM | POA: Diagnosis not present

## 2016-11-20 DIAGNOSIS — E559 Vitamin D deficiency, unspecified: Secondary | ICD-10-CM | POA: Diagnosis not present

## 2016-11-20 DIAGNOSIS — E2839 Other primary ovarian failure: Secondary | ICD-10-CM | POA: Insufficient documentation

## 2016-11-20 DIAGNOSIS — R079 Chest pain, unspecified: Secondary | ICD-10-CM | POA: Insufficient documentation

## 2016-11-20 DIAGNOSIS — Z1231 Encounter for screening mammogram for malignant neoplasm of breast: Secondary | ICD-10-CM

## 2016-11-20 MED ORDER — HYDROCORTISONE ACETATE 25 MG RE SUPP: 25.0000 mg | Freq: Every day | RECTAL | 1 refills | Status: DC

## 2016-11-20 NOTE — Patient Instructions (Addendum)
I sent the hemorrhoid suppositories to your pharmacy - if no improvement please update [place]  Stop at check out for bone density referral Your mammogram is due in march -you can schedule that your self   Avoid red meat/ fried foods/ egg yolks/ fatty breakfast meats/ butter, cheese and high fat dairy/ and shellfish    EKG today  If your chest pain worsens or persists -please let me know   Take care of yourself  Keep exercising

## 2016-11-20 NOTE — Progress Notes (Signed)
Pre visit review using our clinic review tool, if applicable. No additional management support is needed unless otherwise documented below in the visit note. 

## 2016-11-20 NOTE — Progress Notes (Signed)
Subjective:    Patient ID: Melissa Blackwell, female    DOB: 11/23/1946, 70 y.o.   MRN: [id number]  HPI Here for annual follow up of chronic medical problems   She was off her herbal protocol for a while and her lyme symptoms came back Started back on it -and 2 new meds were added   AMW with Katha Cabal was 11/21  Failed hearing at the higher Hz She does not usually notice it - not bothersome    Desires EKG  -has had some chest pain She thinks it is from her lyme-now improved Worse when lying down  It comes and goes with other lyme symptoms    Wt Readings from Last 3 Encounters:  11/20/16 136 lb 8 oz (61.9 kg)  11/12/16 134 lb 12 oz (61.1 kg)  11/13/15 137 lb 12 oz (62.5 kg)  she does exercise regularly - 2-3 days per week 50 minutes of cardio and weights - it helps her feel better   Declines imms for PNA and flu   Mammogram 3/17-negative  Self breast exam-no lumps  Will schedule her own at the breast center   Colonoscopy 10/11-10 year recall   More frequent stools req a lot of wiping and hemorrhoids are bothered  She has had suppositories in the past and needs a refill  Is on several new supplements for now    Tetanus shot 11/14  dexa 1/16- stable osteopenia - wants to schedule her 2 year f/u  Falls-none  Fractures -last was 2010 , she broke her wrist  D level is good 46.8 Takes her ca and D  Hep c screen neg 11/17  Zoster vaccine -did get it Merdis Delay covered   Hx of hyperlipidemia  Lab Results  Component Value Date   CHOL 255 (H) 11/12/2016   CHOL 238 (H) 11/06/2015   CHOL 225 (H) 10/24/2014   Lab Results  Component Value Date   HDL 82.70 11/12/2016   HDL 78.50 11/06/2015   HDL 68.90 10/24/2014   Lab Results  Component Value Date   LDLCALC 156 (H) 11/12/2016   LDLCALC 142 (H) 11/06/2015   LDLCALC 142 (H) 10/24/2014   Lab Results  Component Value Date   TRIG 81.0 11/12/2016   TRIG 86.0 11/06/2015   TRIG 69.0 10/24/2014   Lab Results  Component  Value Date   CHOLHDL 3 11/12/2016   CHOLHDL 3 11/06/2015   CHOLHDL 3 10/24/2014   Lab Results  Component Value Date   LDLDIRECT 124.9 10/14/2013   LDLDIRECT 148.5 10/07/2012   LDLDIRECT 129.0 10/10/2011   Risk ratio is the same as last year  She takes her red yeast rice - once daily - will increase to twice daily  Diet- does not look out for cholesterol   Results for orders placed or performed in visit on 11/12/16  Hepatitis C antibody  Result Value Ref Range   HCV Ab NEGATIVE NEGATIVE  CBC with Differential/Platelet  Result Value Ref Range   WBC 5.3 4.0 - 10.5 K/uL   RBC 4.71 3.87 - 5.11 Mil/uL   Hemoglobin 13.6 12.0 - 15.0 g/dL   HCT 40.5 36.0 - 46.0 %   MCV 86.1 78.0 - 100.0 fl   MCHC 33.5 30.0 - 36.0 g/dL   RDW 14.4 11.5 - 15.5 %   Platelets 212.0 150.0 - 400.0 K/uL   Neutrophils Relative % 66.7 43.0 - 77.0 %   Lymphocytes Relative 25.0 12.0 - 46.0 %   Monocytes Relative 7.1 3.0 -  12.0 %   Eosinophils Relative 0.6 0.0 - 5.0 %   Basophils Relative 0.6 0.0 - 3.0 %   Neutro Abs 3.5 1.4 - 7.7 K/uL   Lymphs Abs 1.3 0.7 - 4.0 K/uL   Monocytes Absolute 0.4 0.1 - 1.0 K/uL   Eosinophils Absolute 0.0 0.0 - 0.7 K/uL   Basophils Absolute 0.0 0.0 - 0.1 K/uL  Comprehensive metabolic panel  Result Value Ref Range   Sodium 141 135 - 145 mEq/L   Potassium 5.1 3.5 - 5.1 mEq/L   Chloride 103 96 - 112 mEq/L   CO2 32 19 - 32 mEq/L   Glucose, Bld 98 70 - 99 mg/dL   BUN 13 6 - 23 mg/dL   Creatinine, Ser 0.79 0.40 - 1.20 mg/dL   Total Bilirubin 0.5 0.2 - 1.2 mg/dL   Alkaline Phosphatase 78 39 - 117 U/L   AST 24 0 - 37 U/L   ALT 23 0 - 35 U/L   Total Protein 7.4 6.0 - 8.3 g/dL   Albumin 4.7 3.5 - 5.2 g/dL   Calcium 10.1 8.4 - 10.5 mg/dL   GFR 76.10 >60.00 mL/min  Lipid panel  Result Value Ref Range   Cholesterol 255 (H) 0 - 200 mg/dL   Triglycerides 81.0 0.0 - 149.0 mg/dL   HDL 82.70 >39.00 mg/dL   VLDL 16.2 0.0 - 40.0 mg/dL   LDL Cholesterol 156 (H) 0 - 99 mg/dL   Total  CHOL/HDL Ratio 3    NonHDL 172.42   TSH  Result Value Ref Range   TSH 1.90 0.35 - 4.50 uIU/mL  VITAMIN D 25 Hydroxy (Vit-D Deficiency, Fractures)  Result Value Ref Range   VITD 46.84 30.00 - 100.00 ng/mL      Patient Active Problem List   Diagnosis Date Noted  . Chest pain 11/20/2016  . Estrogen deficiency 11/20/2016  . Fatigue 11/05/2015  . Vitamin D deficiency 10/31/2014  . Hyperlipidemia 10/31/2014  . Left groin pain 05/04/2014  . Hyperkalemia 10/27/2013  . Encounter for Medicare annual wellness exam 10/13/2013  . Other screening mammogram 10/15/2011  . DIVERTICULOSIS, COLON 08/23/2010  . COLONIC POLYPS, ADENOMATOUS, HX OF 08/23/2010  . UNSPEC SYMPTOM ASSOC W/FEMALE GENITAL ORGANS 08/11/2009  . LYME DISEASE 08/10/2007  . HEMORRHOIDS 07/21/2007  . CONSTIPATION, CHRONIC 07/21/2007  . Osteopenia 07/21/2007  . RHEUMATOID FACTOR, POSITIVE 07/21/2007  . SKIN CANCER, HX OF 07/21/2007   Past Medical History:  Diagnosis Date  . Cancer ([place])    skin CA Hx of squamous cell in situ  . Diverticulosis of sigmoid colon    mild  . GI bleed   . Hx of adenomatous colonic polyps   . Hyperlipidemia   . Internal hemorrhoids   . Lyme disease 06/2007   Dr Knox Royalty  . Osteopenia   . Renal cyst    right renal cyst   Past Surgical History:  Procedure Laterality Date  . ABDOMINAL HYSTERECTOMY  03/1996   partial fibroids  . hemorrhoid banding    . HEMORROIDECTOMY  04/2001  . RHINOPLASTY    . scar removal     keloid  scar  . SPINE SURGERY     spinal fusion   Social History  Substance Use Topics  . Smoking status: Never Smoker  . Smokeless tobacco: Never Used  . Alcohol use 0.0 oz/week     Comment: 1 glass of wine a week- occ   Family History  Problem Relation Age of Onset  . Cancer Mother  pancreatic CA  . Heart disease Mother   . Heart disease Father     CHF and heart problems  . Hyperlipidemia Sister   . Cancer Sister     uterine CA  . Heart disease Maternal  Grandmother    No Known Allergies Current Outpatient Prescriptions on File Prior to Visit  Medication Sig Dispense Refill  . Acetylcysteine (N-ACETYL-L-CYSTEINE) 600 MG CAPS Take 1 tablet by mouth daily.    . calcium carbonate (OS-CAL) 600 MG TABS Take 600 mg by mouth 2 (two) times daily with a meal.      . Chlorophyllin Copper Cmplx Sod POWD Take 1 capsule by mouth daily.     . Cholecalciferol (VITAMIN D3) 5000 UNITS CAPS Take 5,000 Units by mouth daily.    Marland Kitchen KRILL OIL PO Take 1 capsule by mouth daily.      . NON FORMULARY Cowden herbal protocol for lyme disease as directed     . PHOSPHATIDYL CHOLINE PO Take 420 mg by mouth daily.    . Probiotic Product (PROBIOTIC PO) Take 1 capsule by mouth daily.      . Red Yeast Rice Extract 600 MG CAPS Take 1 capsule by mouth 2 (two) times daily.      Marland Kitchen Resveratrol 250 MG CAPS Take 1 capsule by mouth daily.      No current facility-administered medications on file prior to visit.     Review of Systems Review of Systems  Constitutional: Negative for fever, appetite change, and unexpected weight change.  Eyes: Negative for pain and visual disturbance.  Respiratory: Negative for cough and shortness of breath.   Cardiovascular: Negative for palpitations/ PND/orthopnea and pedal edema  Gastrointestinal: Negative for nausea, diarrhea and constipation. pos for hemorrhoids with bleeding occasionally  Genitourinary: Negative for urgency and frequency.  MSK pos for aches and pains Skin: Negative for pallor or rash   Neurological: Negative for weakness, light-headedness, numbness and headaches.  Hematological: Negative for adenopathy. Does not bruise/bleed easily.  Psychiatric/Behavioral: Negative for dysphoric mood. The patient is not nervous/anxious.         Objective:   Physical Exam  Constitutional: She appears well-developed and well-nourished. No distress.  HENT:  Head: Normocephalic and atraumatic.  Right Ear: External ear normal.  Left Ear:  External ear normal.  Mouth/Throat: Oropharynx is clear and moist.  Eyes: Conjunctivae and EOM are normal. Pupils are equal, round, and reactive to light. No scleral icterus.  Neck: Normal range of motion. Neck supple. No JVD present. Carotid bruit is not present. No thyromegaly present.  Cardiovascular: Normal rate, regular rhythm, normal heart sounds and intact distal pulses.  Exam reveals no gallop.   Pulmonary/Chest: Effort normal and breath sounds normal. No respiratory distress. She has no wheezes. She exhibits tenderness.  Mild cw tenderness mid L anterior chest No crepitus or skin changes   Abdominal: Soft. Bowel sounds are normal. She exhibits no distension, no abdominal bruit and no mass. There is no tenderness.  Genitourinary: No breast swelling, tenderness, discharge or bleeding.  Genitourinary Comments: Breast exam: No mass, nodules, thickening, tenderness, bulging, retraction, inflamation, nipple discharge or skin changes noted.  No axillary or clavicular LA.      Musculoskeletal: Normal range of motion. She exhibits no edema or tenderness.  No kyphosis   Lymphadenopathy:    She has no cervical adenopathy.  Neurological: She is alert. She has normal reflexes. No cranial nerve deficit. She exhibits normal muscle tone. Coordination normal.  Skin: Skin is warm and  dry. No rash noted. No erythema. No pallor.  Psychiatric: She has a normal mood and affect.          Assessment & Plan:   Problem List Items Addressed This Visit      Cardiovascular and Mediastinum   Hemorrhoids    Worse lately with some bleeding  Sent in anusol hc for 10 day course Update if no improvement         Musculoskeletal and Integument   Osteopenia - Primary    Disc need for calcium/ vitamin D/ wt bearing exercise and bone density test every 2 y to monitor Disc safety/ fracture risk in detail  dexa ordered  No falls No fx since 2010 D level is good  Does exercise         Other   Vitamin  D deficiency    Vitamin D level is therapeutic with current supplementation Disc importance of this to bone and overall health       Hyperlipidemia    Disc goals for lipids and reasons to control them Rev labs with pt Rev low sat fat diet in detail She will continue to work on diet  She uses red yeast rice       Estrogen deficiency    Ref for dexa      Relevant Orders   DG Bone Density   Chest pain    Pt states this comes and goes with her lyme symptoms  No chf symptoms  Re assuring ekg and exam  Continue to follow and update if this worsens (is currently improved)      Relevant Orders   EKG 12-Lead (Completed)

## 2016-11-21 NOTE — Assessment & Plan Note (Signed)
Disc goals for lipids and reasons to control them Rev labs with pt Rev low sat fat diet in detail She will continue to work on diet  She uses red yeast rice

## 2016-11-21 NOTE — Assessment & Plan Note (Signed)
Worse lately with some bleeding  Sent in anusol hc for 10 day course Update if no improvement

## 2016-11-21 NOTE — Assessment & Plan Note (Signed)
Ref for dexa 

## 2016-11-21 NOTE — Assessment & Plan Note (Signed)
Pt states this comes and goes with her lyme symptoms  No chf symptoms  Re assuring ekg and exam  Continue to follow and update if this worsens (is currently improved)

## 2016-11-21 NOTE — Assessment & Plan Note (Signed)
Disc need for calcium/ vitamin D/ wt bearing exercise and bone density test every 2 y to monitor Disc safety/ fracture risk in detail  dexa ordered  No falls No fx since 2010 D level is good  Does exercise

## 2016-11-21 NOTE — Assessment & Plan Note (Signed)
Vitamin D level is therapeutic with current supplementation Disc importance of this to bone and overall health  

## 2016-11-28 ENCOUNTER — Other Ambulatory Visit: Payer: Self-pay

## 2017-01-29 ENCOUNTER — Encounter: Payer: Self-pay | Admitting: *Deleted

## 2017-01-29 ENCOUNTER — Telehealth: Payer: Self-pay

## 2017-01-29 NOTE — Telephone Encounter (Signed)
Pt left v/m; pt recently had flu and pt lives over one hr from office. Now pt has UTI and does not want to come out to be exposed to flu plus lives over 1 hr from [facility] . Pt request med sent to [facility] . Pt request cb. Pt last seen 11/20/16.

## 2017-01-29 NOTE — Telephone Encounter (Signed)
Left voicemail letting pt know Dr. Tower's comments  

## 2017-01-29 NOTE — Telephone Encounter (Signed)
I cannot do abx over the phone w/o a visit but she may be a candidate for an e visit.  Please tell her how if applicable  Thanks   If symptoms are severe or fever or n/v or flank pain - needs to be seen wherever she can go

## 2017-02-03 DIAGNOSIS — N3000 Acute cystitis without hematuria: Secondary | ICD-10-CM | POA: Diagnosis not present

## 2017-03-03 ENCOUNTER — Other Ambulatory Visit: Payer: Medicare Other

## 2017-03-03 ENCOUNTER — Ambulatory Visit: Payer: Medicare Other

## 2017-03-20 ENCOUNTER — Ambulatory Visit
Admission: RE | Admit: 2017-03-20 | Discharge: 2017-03-20 | Disposition: A | Discharge status: Home / Self Care | Payer: Medicare Other | Source: Ambulatory Visit | Attending: Family Medicine | Admitting: Family Medicine

## 2017-03-20 DIAGNOSIS — Z780 Asymptomatic menopausal state: Secondary | ICD-10-CM | POA: Diagnosis not present

## 2017-03-20 DIAGNOSIS — Z1231 Encounter for screening mammogram for malignant neoplasm of breast: Secondary | ICD-10-CM | POA: Diagnosis not present

## 2017-03-20 DIAGNOSIS — M85851 Other specified disorders of bone density and structure, right thigh: Secondary | ICD-10-CM | POA: Diagnosis not present

## 2017-03-20 DIAGNOSIS — E2839 Other primary ovarian failure: Secondary | ICD-10-CM

## 2017-03-25 ENCOUNTER — Encounter: Payer: Self-pay | Admitting: *Deleted

## 2017-04-29 DIAGNOSIS — L820 Inflamed seborrheic keratosis: Secondary | ICD-10-CM | POA: Diagnosis not present

## 2017-04-29 DIAGNOSIS — B009 Herpesviral infection, unspecified: Secondary | ICD-10-CM | POA: Diagnosis not present

## 2017-04-29 DIAGNOSIS — D1801 Hemangioma of skin and subcutaneous tissue: Secondary | ICD-10-CM | POA: Diagnosis not present

## 2017-04-29 DIAGNOSIS — L905 Scar conditions and fibrosis of skin: Secondary | ICD-10-CM | POA: Diagnosis not present

## 2017-04-29 DIAGNOSIS — L821 Other seborrheic keratosis: Secondary | ICD-10-CM | POA: Diagnosis not present

## 2017-04-29 DIAGNOSIS — Z85828 Personal history of other malignant neoplasm of skin: Secondary | ICD-10-CM | POA: Diagnosis not present

## 2017-07-01 DIAGNOSIS — H6122 Impacted cerumen, left ear: Secondary | ICD-10-CM | POA: Diagnosis not present

## 2017-07-01 DIAGNOSIS — J00 Acute nasopharyngitis [common cold]: Secondary | ICD-10-CM | POA: Diagnosis not present

## 2017-08-05 DIAGNOSIS — H2513 Age-related nuclear cataract, bilateral: Secondary | ICD-10-CM | POA: Diagnosis not present

## 2017-08-05 DIAGNOSIS — H40013 Open angle with borderline findings, low risk, bilateral: Secondary | ICD-10-CM | POA: Diagnosis not present

## 2017-09-30 DIAGNOSIS — G909 Disorder of the autonomic nervous system, unspecified: Secondary | ICD-10-CM | POA: Diagnosis not present

## 2017-09-30 DIAGNOSIS — M2550 Pain in unspecified joint: Secondary | ICD-10-CM | POA: Diagnosis not present

## 2017-09-30 DIAGNOSIS — M549 Dorsalgia, unspecified: Secondary | ICD-10-CM | POA: Diagnosis not present

## 2017-09-30 DIAGNOSIS — R5381 Other malaise: Secondary | ICD-10-CM | POA: Diagnosis not present

## 2017-09-30 DIAGNOSIS — R5383 Other fatigue: Secondary | ICD-10-CM | POA: Diagnosis not present

## 2017-10-29 DIAGNOSIS — L905 Scar conditions and fibrosis of skin: Secondary | ICD-10-CM | POA: Diagnosis not present

## 2017-10-29 DIAGNOSIS — L821 Other seborrheic keratosis: Secondary | ICD-10-CM | POA: Diagnosis not present

## 2017-10-29 DIAGNOSIS — L820 Inflamed seborrheic keratosis: Secondary | ICD-10-CM | POA: Diagnosis not present

## 2017-10-29 DIAGNOSIS — D1801 Hemangioma of skin and subcutaneous tissue: Secondary | ICD-10-CM | POA: Diagnosis not present

## 2017-10-29 DIAGNOSIS — Z85828 Personal history of other malignant neoplasm of skin: Secondary | ICD-10-CM | POA: Diagnosis not present

## 2017-11-20 ENCOUNTER — Ambulatory Visit: Payer: Medicare Other

## 2017-11-25 ENCOUNTER — Ambulatory Visit: Payer: Medicare Other | Admitting: Family Medicine

## 2017-12-03 ENCOUNTER — Telehealth: Payer: Self-pay | Admitting: Family Medicine

## 2017-12-03 ENCOUNTER — Other Ambulatory Visit (INDEPENDENT_AMBULATORY_CARE_PROVIDER_SITE_OTHER): Payer: Medicare Other

## 2017-12-03 ENCOUNTER — Ambulatory Visit: Payer: Medicare Other

## 2017-12-03 DIAGNOSIS — E559 Vitamin D deficiency, unspecified: Secondary | ICD-10-CM | POA: Diagnosis not present

## 2017-12-03 DIAGNOSIS — E7800 Pure hypercholesterolemia, unspecified: Secondary | ICD-10-CM | POA: Diagnosis not present

## 2017-12-03 DIAGNOSIS — E875 Hyperkalemia: Secondary | ICD-10-CM

## 2017-12-03 DIAGNOSIS — R5383 Other fatigue: Secondary | ICD-10-CM | POA: Diagnosis not present

## 2017-12-03 LAB — COMPREHENSIVE METABOLIC PANEL
ALT: 20 U/L (ref 0–35)
AST: 23 U/L (ref 0–37)
Albumin: 4.6 g/dL (ref 3.5–5.2)
Alkaline Phosphatase: 68 U/L (ref 39–117)
BUN: 14 mg/dL (ref 6–23)
CO2: 32 mEq/L (ref 19–32)
Calcium: 9.6 mg/dL (ref 8.4–10.5)
Chloride: 104 mEq/L (ref 96–112)
Creatinine, Ser: 0.74 mg/dL (ref 0.40–1.20)
GFR: 81.82 mL/min (ref >60.00)
Glucose, Bld: 98 mg/dL (ref 70–99)
Potassium: 5.3 mEq/L — ABNORMAL HIGH (ref 3.5–5.1)
Sodium: 143 mEq/L (ref 135–145)
Total Bilirubin: 0.5 mg/dL (ref 0.2–1.2)
Total Protein: 6.9 g/dL (ref 6.0–8.3)

## 2017-12-03 LAB — CBC WITH DIFFERENTIAL/PLATELET
Basophils Absolute: 0 10*3/uL (ref 0.0–0.1)
Basophils Relative: 0.9 % (ref 0.0–3.0)
Eosinophils Absolute: 0 10*3/uL (ref 0.0–0.7)
Eosinophils Relative: 1.1 % (ref 0.0–5.0)
HCT: 40.3 % (ref 36.0–46.0)
Hemoglobin: 13.2 g/dL (ref 12.0–15.0)
Lymphocytes Relative: 28 % (ref 12.0–46.0)
Lymphs Abs: 1.2 10*3/uL (ref 0.7–4.0)
MCHC: 32.8 g/dL (ref 30.0–36.0)
MCV: 89.8 fl (ref 78.0–100.0)
Monocytes Absolute: 0.3 10*3/uL (ref 0.1–1.0)
Monocytes Relative: 7.5 % (ref 3.0–12.0)
Neutro Abs: 2.7 10*3/uL (ref 1.4–7.7)
Neutrophils Relative %: 62.5 % (ref 43.0–77.0)
Platelets: 191 10*3/uL (ref 150.0–400.0)
RBC: 4.49 Mil/uL (ref 3.87–5.11)
RDW: 13.9 % (ref 11.5–15.5)
WBC: 4.3 10*3/uL (ref 4.0–10.5)

## 2017-12-03 LAB — LIPID PANEL
Cholesterol: 233 mg/dL — ABNORMAL HIGH (ref 0–200)
HDL: 77.4 mg/dL (ref >39.00)
LDL Cholesterol: 139 mg/dL — ABNORMAL HIGH (ref 0–99)
NonHDL: 155.15
Total CHOL/HDL Ratio: 3
Triglycerides: 79 mg/dL (ref 0.0–149.0)
VLDL: 15.8 mg/dL (ref 0.0–40.0)

## 2017-12-03 LAB — TSH: TSH: 2.16 u[IU]/mL (ref 0.35–4.50)

## 2017-12-03 LAB — VITAMIN D 25 HYDROXY (VIT D DEFICIENCY, FRACTURES): VITD: 58.7 ng/mL (ref 30.00–100.00)

## 2017-12-03 NOTE — Telephone Encounter (Signed)
-----   Message from Ellamae Sia sent at 12/03/2017 10:38 AM EST ----- Regarding: lab orders asap Patient is scheduled for CPX labs, please order future labs, Thanks , Karna Christmas

## 2017-12-10 ENCOUNTER — Encounter: Payer: Self-pay | Admitting: Family Medicine

## 2017-12-10 ENCOUNTER — Ambulatory Visit (INDEPENDENT_AMBULATORY_CARE_PROVIDER_SITE_OTHER): Payer: Medicare Other | Admitting: Family Medicine

## 2017-12-10 VITALS — BP 142/88 | HR 63 | Temp 98.1°F | Ht 63.0 in | Wt 136.5 lb

## 2017-12-10 DIAGNOSIS — M8589 Other specified disorders of bone density and structure, multiple sites: Secondary | ICD-10-CM | POA: Diagnosis not present

## 2017-12-10 DIAGNOSIS — E7800 Pure hypercholesterolemia, unspecified: Secondary | ICD-10-CM | POA: Diagnosis not present

## 2017-12-10 DIAGNOSIS — E559 Vitamin D deficiency, unspecified: Secondary | ICD-10-CM | POA: Diagnosis not present

## 2017-12-10 DIAGNOSIS — E875 Hyperkalemia: Secondary | ICD-10-CM

## 2017-12-10 DIAGNOSIS — R339 Retention of urine, unspecified: Secondary | ICD-10-CM | POA: Diagnosis not present

## 2017-12-10 DIAGNOSIS — R5383 Other fatigue: Secondary | ICD-10-CM | POA: Diagnosis not present

## 2017-12-10 DIAGNOSIS — A6920 Lyme disease, unspecified: Secondary | ICD-10-CM

## 2017-12-10 LAB — POC URINALSYSI DIPSTICK (AUTOMATED)
Bilirubin, UA: NEGATIVE
Blood, UA: NEGATIVE
Glucose, UA: NEGATIVE
Ketones, UA: NEGATIVE
Leukocytes, UA: NEGATIVE
Nitrite, UA: NEGATIVE
Protein, UA: NEGATIVE
Spec Grav, UA: 1.015 (ref 1.010–1.025)
Urobilinogen, UA: 0.2 E.U./dL
pH, UA: 6.5 (ref 5.0–8.0)

## 2017-12-10 NOTE — Patient Instructions (Addendum)
For cholesterol  Avoid red meat/ fried foods/ egg yolks/ fatty breakfast meats/ butter, cheese and high fat dairy/ and shellfish    Potassium is a little high - avoid supplements high in potassium  You may want to drink different source of water   Take care of yourself Stay active

## 2017-12-10 NOTE — Progress Notes (Signed)
Subjective:    Patient ID: Melissa Blackwell, female    DOB: 12/04/1946, 71 y.o.   MRN: [id number]  HPI Here for annual f/u of chronic health problems   Doing fairly well in general    Missed amw due to weather   Wt Readings from Last 3 Encounters:  12/10/17 136 lb 8 oz (61.9 kg)  11/20/16 136 lb 8 oz (61.9 kg)  11/12/16 134 lb 12 oz (61.1 kg)  very good at mt her wt  Exercise 2-3 days per week  24.18 kg/m   Wants to check UA today -slow stream at times/wants to r/o uti  Clear today  Drinks a fair amount of water   fam hx: Mother with pancreatic cancer  Sister with uterine cancer   Pt has had a hysterectomy   Declines flu shots  Declines pneumonia shots   Mammogram 3/18 neg- nl  Self breast exam- no lumps   Colonoscopy 10/11- 10 y recall   dexa 3/18  Osteopenia with some imp  D level is 58.7 She took a supplement called "bone restore" Exercising  No falls or fractures   Hyperlipidemia Lab Results  Component Value Date   CHOL 233 (H) 12/03/2017   CHOL 255 (H) 11/12/2016   CHOL 238 (H) 11/06/2015   Lab Results  Component Value Date   HDL 77.40 12/03/2017   HDL 82.70 11/12/2016   HDL 78.50 11/06/2015   Lab Results  Component Value Date   LDLCALC 139 (H) 12/03/2017   LDLCALC 156 (H) 11/12/2016   LDLCALC 142 (H) 11/06/2015   Lab Results  Component Value Date   TRIG 79.0 12/03/2017   TRIG 81.0 11/12/2016   TRIG 86.0 11/06/2015   Lab Results  Component Value Date   CHOLHDL 3 12/03/2017   CHOLHDL 3 11/12/2016   CHOLHDL 3 11/06/2015   Lab Results  Component Value Date   LDLDIRECT 124.9 10/14/2013   LDLDIRECT 148.5 10/07/2012   LDLDIRECT 129.0 10/10/2011  LDL is improved  She takes tumeric  Still taking red yeast rice  Avoids fried food and does not like fried food    Fatigue -continues with lyme dz diagnosis She stopped her herbal protocol however  Results for orders placed or performed in visit on 12/03/17  VITAMIN D 25 Hydroxy (Vit-D  Deficiency, Fractures)  Result Value Ref Range   VITD 58.70 30.00 - 100.00 ng/mL  TSH  Result Value Ref Range   TSH 2.16 0.35 - 4.50 uIU/mL  Lipid panel  Result Value Ref Range   Cholesterol 233 (H) 0 - 200 mg/dL   Triglycerides 79.0 0.0 - 149.0 mg/dL   HDL 77.40 >39.00 mg/dL   VLDL 15.8 0.0 - 40.0 mg/dL   LDL Cholesterol 139 (H) 0 - 99 mg/dL   Total CHOL/HDL Ratio 3    NonHDL 155.15   Comprehensive metabolic panel  Result Value Ref Range   Sodium 143 135 - 145 mEq/L   Potassium 5.3 (H) 3.5 - 5.1 mEq/L   Chloride 104 96 - 112 mEq/L   CO2 32 19 - 32 mEq/L   Glucose, Bld 98 70 - 99 mg/dL   BUN 14 6 - 23 mg/dL   Creatinine, Ser 0.74 0.40 - 1.20 mg/dL   Total Bilirubin 0.5 0.2 - 1.2 mg/dL   Alkaline Phosphatase 68 39 - 117 U/L   AST 23 0 - 37 U/L   ALT 20 0 - 35 U/L   Total Protein 6.9 6.0 - 8.3 g/dL  Albumin 4.6 3.5 - 5.2 g/dL   Calcium 9.6 8.4 - 10.5 mg/dL   GFR 81.82 >60.00 mL/min  CBC with Differential/Platelet  Result Value Ref Range   WBC 4.3 4.0 - 10.5 K/uL   RBC 4.49 3.87 - 5.11 Mil/uL   Hemoglobin 13.2 12.0 - 15.0 g/dL   HCT 40.3 36.0 - 46.0 %   MCV 89.8 78.0 - 100.0 fl   MCHC 32.8 30.0 - 36.0 g/dL   RDW 13.9 11.5 - 15.5 %   Platelets 191.0 150.0 - 400.0 K/uL   Neutrophils Relative % 62.5 43.0 - 77.0 %   Lymphocytes Relative 28.0 12.0 - 46.0 %   Monocytes Relative 7.5 3.0 - 12.0 %   Eosinophils Relative 1.1 0.0 - 5.0 %   Basophils Relative 0.9 0.0 - 3.0 %   Neutro Abs 2.7 1.4 - 7.7 K/uL   Lymphs Abs 1.2 0.7 - 4.0 K/uL   Monocytes Absolute 0.3 0.1 - 1.0 K/uL   Eosinophils Absolute 0.0 0.0 - 0.7 K/uL   Basophils Absolute 0.0 0.0 - 0.1 K/uL    Patient Active Problem List   Diagnosis Date Noted  . Chest pain 11/20/2016  . Estrogen deficiency 11/20/2016  . Fatigue 11/05/2015  . Vitamin D deficiency 10/31/2014  . Hyperlipidemia 10/31/2014  . Left groin pain 05/04/2014  . Hyperkalemia 10/27/2013  . Encounter for Medicare annual wellness exam 10/13/2013  .  Other screening mammogram 10/15/2011  . DIVERTICULOSIS, COLON 08/23/2010  . COLONIC POLYPS, ADENOMATOUS, HX OF 08/23/2010  . UNSPEC SYMPTOM ASSOC W/FEMALE GENITAL ORGANS 08/11/2009  . LYME DISEASE 08/10/2007  . Hemorrhoids 07/21/2007  . CONSTIPATION, CHRONIC 07/21/2007  . Osteopenia 07/21/2007  . RHEUMATOID FACTOR, POSITIVE 07/21/2007  . SKIN CANCER, HX OF 07/21/2007   Past Medical History:  Diagnosis Date  . Cancer ([place])    skin CA Hx of squamous cell in situ  . Diverticulosis of sigmoid colon    mild  . GI bleed   . Hx of adenomatous colonic polyps   . Hyperlipidemia   . Internal hemorrhoids   . Lyme disease 06/2007   Dr Knox Royalty  . Osteopenia   . Renal cyst    right renal cyst   Past Surgical History:  Procedure Laterality Date  . ABDOMINAL HYSTERECTOMY  03/1996   partial fibroids  . hemorrhoid banding    . HEMORROIDECTOMY  04/2001  . RHINOPLASTY    . scar removal     keloid  scar  . SPINE SURGERY     spinal fusion   Social History   Tobacco Use  . Smoking status: Never Smoker  . Smokeless tobacco: Never Used  Substance Use Topics  . Alcohol use: Yes    Alcohol/week: 0.0 oz    Comment: 1 glass of wine a week- occ  . Drug use: No   Family History  Problem Relation Age of Onset  . Cancer Mother        pancreatic CA  . Heart disease Mother   . Heart disease Father        CHF and heart problems  . Hyperlipidemia Sister   . Cancer Sister        uterine CA  . Heart disease Maternal Grandmother    No Known Allergies Current Outpatient Medications on File Prior to Visit  Medication Sig Dispense Refill  . Acetylcysteine (N-ACETYL-L-CYSTEINE) 600 MG CAPS Take 1 tablet by mouth daily.    . calcium carbonate (OS-CAL) 600 MG TABS Take 600 mg by mouth  2 (two) times daily with a meal.      . Chlorophyllin Copper Cmplx Sod POWD Take 1 capsule by mouth daily.     . Cholecalciferol (VITAMIN D3) 5000 UNITS CAPS Take 5,000 Units by mouth daily.    . Multiple Vitamin  (MULTIVITAMIN) capsule Take 1 capsule by mouth daily.    . NON FORMULARY [place] herbal protocol for lyme disease as directed     . Omega-3 Fatty Acids (OMEGA 3 PO) Take 1 capsule by mouth daily.    . Probiotic Product (PROBIOTIC PO) Take 1 capsule by mouth daily.      . Red Yeast Rice Extract 600 MG CAPS Take 1 capsule by mouth 2 (two) times daily.      Marland Kitchen Resveratrol 250 MG CAPS Take 1 capsule by mouth daily.      No current facility-administered medications on file prior to visit.     Review of Systems  Constitutional: Positive for fatigue. Negative for activity change, appetite change, fever and unexpected weight change.  HENT: Negative for congestion, ear pain, rhinorrhea, sinus pressure and sore throat.   Eyes: Negative for pain, redness and visual disturbance.  Respiratory: Negative for cough, shortness of breath and wheezing.   Cardiovascular: Negative for chest pain and palpitations.  Gastrointestinal: Negative for abdominal pain, blood in stool, constipation and diarrhea.  Endocrine: Negative for polydipsia and polyuria.  Genitourinary: Negative for dysuria, flank pain, frequency, hematuria and urgency.  Musculoskeletal: Positive for arthralgias and myalgias. Negative for back pain.  Skin: Negative for pallor and rash.  Allergic/Immunologic: Negative for environmental allergies.  Neurological: Negative for dizziness, syncope and headaches.  Hematological: Negative for adenopathy. Does not bruise/bleed easily.  Psychiatric/Behavioral: Negative for decreased concentration and dysphoric mood. The patient is not nervous/anxious.        Objective:   Physical Exam  Constitutional: She appears well-developed and well-nourished. No distress.  Well appearing   HENT:  Head: Normocephalic and atraumatic.  Right Ear: External ear normal.  Left Ear: External ear normal.  Mouth/Throat: Oropharynx is clear and moist.  Eyes: Conjunctivae and EOM are normal. Pupils are equal, round, and  reactive to light. No scleral icterus.  Neck: Normal range of motion. Neck supple. No JVD present. Carotid bruit is not present. No thyromegaly present.  Cardiovascular: Normal rate, regular rhythm, normal heart sounds and intact distal pulses. Exam reveals no gallop.  Pulmonary/Chest: Effort normal and breath sounds normal. No respiratory distress. She has no wheezes. She exhibits no tenderness.  Abdominal: Soft. Bowel sounds are normal. She exhibits no distension, no abdominal bruit and no mass. There is no tenderness.  Genitourinary: No breast swelling, tenderness, discharge or bleeding.  Genitourinary Comments: Breast exam: No mass, nodules, thickening, tenderness, bulging, retraction, inflamation, nipple discharge or skin changes noted.  No axillary or clavicular LA.      Musculoskeletal: Normal range of motion. She exhibits no edema or tenderness.  No kyphosis No acute joint changes   Lymphadenopathy:    She has no cervical adenopathy.  Neurological: She is alert. She has normal reflexes. No cranial nerve deficit. She exhibits normal muscle tone. Coordination normal.  Skin: Skin is warm and dry. No rash noted. No erythema. No pallor.  Psychiatric: She has a normal mood and affect.          Assessment & Plan:   Problem List Items Addressed This Visit      Musculoskeletal and Integument   Osteopenia    Improvement with dexa 3/18 D level  in 68s  No falls or fx  Re check 2 y          Other   Fatigue    Labs rev  Chronic from lyme dz unchanged      Hyperkalemia    Mild  Disc avoidance of excess K in supplements or otc meds Disc diet       Hyperlipidemia - Primary    Disc goals for lipids and reasons to control them Rev labs with pt Rev low sat fat diet in detail  LDL improved Taking red yeast rice and tumeric Diet is good      LYME DISEASE    Doing fairly well - no longer on the herbal/supplement protocol      Vitamin D deficiency    Vitamin D level is  therapeutic with current supplementation Disc importance of this to bone and overall health        Other Visit Diagnoses    Urinary retention       Relevant Orders   POCT Urinalysis Dipstick (Automated) (Completed)

## 2017-12-11 NOTE — Assessment & Plan Note (Signed)
Improvement with dexa 3/18 D level in 50s  No falls or fx  Re check 2 y

## 2017-12-11 NOTE — Assessment & Plan Note (Signed)
Disc goals for lipids and reasons to control them Rev labs with pt Rev low sat fat diet in detail  LDL improved Taking red yeast rice and tumeric Diet is good

## 2017-12-11 NOTE — Assessment & Plan Note (Signed)
Mild  Disc avoidance of excess K in supplements or otc meds Disc diet

## 2017-12-11 NOTE — Assessment & Plan Note (Signed)
Doing fairly well - no longer on the herbal/supplement protocol

## 2017-12-11 NOTE — Assessment & Plan Note (Signed)
Labs rev  Chronic from lyme dz unchanged

## 2017-12-11 NOTE — Assessment & Plan Note (Signed)
Vitamin D level is therapeutic with current supplementation Disc importance of this to bone and overall health  

## 2018-01-08 ENCOUNTER — Encounter: Payer: Self-pay | Admitting: Gastroenterology

## 2018-03-02 ENCOUNTER — Ambulatory Visit: Payer: Medicare Other | Admitting: Gastroenterology

## 2018-04-24 ENCOUNTER — Other Ambulatory Visit: Payer: Self-pay | Admitting: Family Medicine

## 2018-04-24 DIAGNOSIS — Z1231 Encounter for screening mammogram for malignant neoplasm of breast: Secondary | ICD-10-CM

## 2018-04-30 DIAGNOSIS — M25561 Pain in right knee: Secondary | ICD-10-CM | POA: Diagnosis not present

## 2018-05-04 DIAGNOSIS — Z85828 Personal history of other malignant neoplasm of skin: Secondary | ICD-10-CM | POA: Diagnosis not present

## 2018-05-04 DIAGNOSIS — L905 Scar conditions and fibrosis of skin: Secondary | ICD-10-CM | POA: Diagnosis not present

## 2018-05-04 DIAGNOSIS — L820 Inflamed seborrheic keratosis: Secondary | ICD-10-CM | POA: Diagnosis not present

## 2018-05-04 DIAGNOSIS — D1801 Hemangioma of skin and subcutaneous tissue: Secondary | ICD-10-CM | POA: Diagnosis not present

## 2018-05-04 DIAGNOSIS — L821 Other seborrheic keratosis: Secondary | ICD-10-CM | POA: Diagnosis not present

## 2018-05-14 ENCOUNTER — Ambulatory Visit
Admission: RE | Admit: 2018-05-14 | Discharge: 2018-05-14 | Disposition: A | Discharge status: Home / Self Care | Payer: Medicare Other | Source: Ambulatory Visit | Attending: Family Medicine | Admitting: Family Medicine

## 2018-05-14 DIAGNOSIS — Z1231 Encounter for screening mammogram for malignant neoplasm of breast: Secondary | ICD-10-CM

## 2018-07-20 DIAGNOSIS — M25561 Pain in right knee: Secondary | ICD-10-CM | POA: Diagnosis not present

## 2018-07-23 DIAGNOSIS — H6123 Impacted cerumen, bilateral: Secondary | ICD-10-CM | POA: Diagnosis not present

## 2018-08-05 DIAGNOSIS — Z85828 Personal history of other malignant neoplasm of skin: Secondary | ICD-10-CM | POA: Diagnosis not present

## 2018-08-05 DIAGNOSIS — C44722 Squamous cell carcinoma of skin of right lower limb, including hip: Secondary | ICD-10-CM | POA: Diagnosis not present

## 2018-08-05 DIAGNOSIS — L905 Scar conditions and fibrosis of skin: Secondary | ICD-10-CM | POA: Diagnosis not present

## 2018-08-05 DIAGNOSIS — L821 Other seborrheic keratosis: Secondary | ICD-10-CM | POA: Diagnosis not present

## 2018-08-05 DIAGNOSIS — D485 Neoplasm of uncertain behavior of skin: Secondary | ICD-10-CM | POA: Diagnosis not present

## 2018-08-11 DIAGNOSIS — H2513 Age-related nuclear cataract, bilateral: Secondary | ICD-10-CM | POA: Diagnosis not present

## 2018-08-11 DIAGNOSIS — H40023 Open angle with borderline findings, high risk, bilateral: Secondary | ICD-10-CM | POA: Diagnosis not present

## 2018-08-26 DIAGNOSIS — C44722 Squamous cell carcinoma of skin of right lower limb, including hip: Secondary | ICD-10-CM | POA: Diagnosis not present

## 2018-08-26 DIAGNOSIS — L905 Scar conditions and fibrosis of skin: Secondary | ICD-10-CM | POA: Diagnosis not present

## 2018-10-20 DIAGNOSIS — H16042 Marginal corneal ulcer, left eye: Secondary | ICD-10-CM | POA: Diagnosis not present

## 2018-10-23 DIAGNOSIS — H16042 Marginal corneal ulcer, left eye: Secondary | ICD-10-CM | POA: Diagnosis not present

## 2018-10-30 DIAGNOSIS — H16042 Marginal corneal ulcer, left eye: Secondary | ICD-10-CM | POA: Diagnosis not present

## 2018-12-03 DIAGNOSIS — M1711 Unilateral primary osteoarthritis, right knee: Secondary | ICD-10-CM | POA: Diagnosis not present

## 2018-12-08 DIAGNOSIS — D1801 Hemangioma of skin and subcutaneous tissue: Secondary | ICD-10-CM | POA: Diagnosis not present

## 2018-12-08 DIAGNOSIS — L905 Scar conditions and fibrosis of skin: Secondary | ICD-10-CM | POA: Diagnosis not present

## 2018-12-08 DIAGNOSIS — L821 Other seborrheic keratosis: Secondary | ICD-10-CM | POA: Diagnosis not present

## 2018-12-08 DIAGNOSIS — Z85828 Personal history of other malignant neoplasm of skin: Secondary | ICD-10-CM | POA: Diagnosis not present

## 2018-12-17 ENCOUNTER — Telehealth: Payer: Self-pay | Admitting: Family Medicine

## 2018-12-17 DIAGNOSIS — E7800 Pure hypercholesterolemia, unspecified: Secondary | ICD-10-CM

## 2018-12-17 DIAGNOSIS — E559 Vitamin D deficiency, unspecified: Secondary | ICD-10-CM

## 2018-12-17 DIAGNOSIS — R5382 Chronic fatigue, unspecified: Secondary | ICD-10-CM

## 2018-12-17 NOTE — Telephone Encounter (Signed)
-----   Message from Eustace Pen, LPN sent at 12/26/457  4:20 PM EST ----- Regarding: Labs 12/27 Lab orders needed. Thank you.  Insurance:  [employer]

## 2018-12-18 ENCOUNTER — Ambulatory Visit (INDEPENDENT_AMBULATORY_CARE_PROVIDER_SITE_OTHER): Payer: Medicare Other

## 2018-12-18 VITALS — BP 118/76 | HR 67 | Temp 98.0°F | Ht 63.0 in | Wt 134.8 lb

## 2018-12-18 DIAGNOSIS — E559 Vitamin D deficiency, unspecified: Secondary | ICD-10-CM | POA: Diagnosis not present

## 2018-12-18 DIAGNOSIS — E7800 Pure hypercholesterolemia, unspecified: Secondary | ICD-10-CM | POA: Diagnosis not present

## 2018-12-18 DIAGNOSIS — R5382 Chronic fatigue, unspecified: Secondary | ICD-10-CM | POA: Diagnosis not present

## 2018-12-18 DIAGNOSIS — Z0000 Encounter for general adult medical examination without abnormal findings: Secondary | ICD-10-CM | POA: Diagnosis not present

## 2018-12-18 LAB — COMPREHENSIVE METABOLIC PANEL
ALT: 16 U/L (ref 0–35)
AST: 21 U/L (ref 0–37)
Albumin: 4.7 g/dL (ref 3.5–5.2)
Alkaline Phosphatase: 63 U/L (ref 39–117)
BUN: 18 mg/dL (ref 6–23)
CO2: 27 mEq/L (ref 19–32)
Calcium: 10.3 mg/dL (ref 8.4–10.5)
Chloride: 104 mEq/L (ref 96–112)
Creatinine, Ser: 0.78 mg/dL (ref 0.40–1.20)
GFR: 76.78 mL/min (ref >60.00)
Glucose, Bld: 96 mg/dL (ref 70–99)
Potassium: 4.7 mEq/L (ref 3.5–5.1)
Sodium: 140 mEq/L (ref 135–145)
Total Bilirubin: 0.4 mg/dL (ref 0.2–1.2)
Total Protein: 7.2 g/dL (ref 6.0–8.3)

## 2018-12-18 LAB — CBC WITH DIFFERENTIAL/PLATELET
Basophils Absolute: 0.1 10*3/uL (ref 0.0–0.1)
Basophils Relative: 1 % (ref 0.0–3.0)
Eosinophils Absolute: 0.1 10*3/uL (ref 0.0–0.7)
Eosinophils Relative: 1.1 % (ref 0.0–5.0)
HCT: 40.1 % (ref 36.0–46.0)
Hemoglobin: 13.4 g/dL (ref 12.0–15.0)
Lymphocytes Relative: 24.7 % (ref 12.0–46.0)
Lymphs Abs: 1.3 10*3/uL (ref 0.7–4.0)
MCHC: 33.5 g/dL (ref 30.0–36.0)
MCV: 87.1 fl (ref 78.0–100.0)
Monocytes Absolute: 0.4 10*3/uL (ref 0.1–1.0)
Monocytes Relative: 7.2 % (ref 3.0–12.0)
Neutro Abs: 3.6 10*3/uL (ref 1.4–7.7)
Neutrophils Relative %: 66 % (ref 43.0–77.0)
Platelets: 202 10*3/uL (ref 150.0–400.0)
RBC: 4.6 Mil/uL (ref 3.87–5.11)
RDW: 14 % (ref 11.5–15.5)
WBC: 5.4 10*3/uL (ref 4.0–10.5)

## 2018-12-18 LAB — LIPID PANEL
Cholesterol: 257 mg/dL — ABNORMAL HIGH (ref 0–200)
HDL: 75.8 mg/dL (ref >39.00)
LDL Cholesterol: 160 mg/dL — ABNORMAL HIGH (ref 0–99)
NonHDL: 181.36
Total CHOL/HDL Ratio: 3
Triglycerides: 107 mg/dL (ref 0.0–149.0)
VLDL: 21.4 mg/dL (ref 0.0–40.0)

## 2018-12-18 LAB — VITAMIN D 25 HYDROXY (VIT D DEFICIENCY, FRACTURES): VITD: 62.97 ng/mL (ref 30.00–100.00)

## 2018-12-18 LAB — TSH: TSH: 1.53 u[IU]/mL (ref 0.35–4.50)

## 2018-12-18 NOTE — Progress Notes (Signed)
PCP notes:   Health maintenance:  No gaps identified.  Abnormal screenings:   Hearing - failed  Hearing Screening   125Hz  250Hz  500Hz  1000Hz  2000Hz  3000Hz  4000Hz  6000Hz  8000Hz   Right ear:   40 40 40  0    Left ear:   40 40 40  0     Mini-Cog score: 19/20 MMSE - Mini Mental State Exam 12/18/2018 11/12/2016  Orientation to time 5 5  Orientation to Place 5 5  Registration 3 3  Attention/ Calculation 0 0  Recall 2 3  Recall-comments unable to recall 1 of 3 words -  Language- name 2 objects 0 0  Language- repeat 1 1  Language- follow 3 step command 3 3  Language- read & follow direction 0 0  Write a sentence 0 0  Copy design 0 0  Total score 19 20    Patient concerns:   None  Nurse concerns:  None  Next PCP appt:   12/25/18 @ 1130  I reviewed health advisor's note, was available for consultation, and agree with documentation and plan. Loura Pardon MD

## 2018-12-18 NOTE — Patient Instructions (Signed)
Melissa Blackwell , Thank you for taking time to come for your Medicare Wellness Visit. I appreciate your ongoing commitment to your health goals. Please review the following plan we discussed and let me know if I can assist you in the future.   These are the goals we discussed: Goals    . Increase physical activity     Starting 12/18/2018, I will continue to exercise at least 50 min 2-3 days per week.        This is a list of the screening recommended for you and due dates:  Health Maintenance  Topic Date Due  . Flu Shot  01/24/2021*  . Pneumonia vaccines (1 of 2 - PCV13) 01/24/2021*  . Mammogram  05/15/2019  . Colon Cancer Screening  10/05/2020  . Tetanus Vaccine  10/28/2023  . DEXA scan (bone density measurement)  Completed  .  Hepatitis C: One time screening is recommended by Center for Disease Control  (CDC) for  adults born from [birth year] through 1966.   Completed  *Topic was postponed. The date shown is not the original due date.   Preventive Care for Adults  A healthy lifestyle and preventive care can promote health and wellness. Preventive health guidelines for adults include the following key practices.  . A routine yearly physical is a good way to check with your health care provider about your health and preventive screening. It is a chance to share any concerns and updates on your health and to receive a thorough exam.  . Visit your dentist for a routine exam and preventive care every 6 months. Brush your teeth twice a day and floss once a day. Good oral hygiene prevents tooth decay and gum disease.  . The frequency of eye exams is based on your age, health, family medical history, use  of contact lenses, and other factors. Follow your health care provider's recommendations for frequency of eye exams.  . Eat a healthy diet. Foods like vegetables, fruits, whole grains, low-fat dairy products, and lean protein foods contain the nutrients you need without too many calories. Decrease  your intake of foods high in solid fats, added sugars, and salt. Eat the right amount of calories for you. Get information about a proper diet from your health care provider, if necessary.  . Regular physical exercise is one of the most important things you can do for your health. Most adults should get at least 150 minutes of moderate-intensity exercise (any activity that increases your heart rate and causes you to sweat) each week. In addition, most adults need muscle-strengthening exercises on 2 or more days a week.  Silver Sneakers may be a benefit available to you. To determine eligibility, you may visit the website: www.silversneakers.com or contact program at (954)068-2307 Mon-Fri between 8AM-8PM.   . Maintain a healthy weight. The body mass index (BMI) is a screening tool to identify possible weight problems. It provides an estimate of body fat based on height and weight. Your health care provider can find your BMI and can help you achieve or maintain a healthy weight.   For adults 20 years and older: ? A BMI below 18.5 is considered underweight. ? A BMI of 18.5 to 24.9 is normal. ? A BMI of 25 to 29.9 is considered overweight. ? A BMI of 30 and above is considered obese.   . Maintain normal blood lipids and cholesterol levels by exercising and minimizing your intake of saturated fat. Eat a balanced diet with plenty of fruit and  vegetables. Blood tests for lipids and cholesterol should begin at age 13 and be repeated every 5 years. If your lipid or cholesterol levels are high, you are over 50, or you are at high risk for heart disease, you may need your cholesterol levels checked more frequently. Ongoing high lipid and cholesterol levels should be treated with medicines if diet and exercise are not working.  . If you smoke, find out from your health care provider how to quit. If you do not use tobacco, please do not start.  . If you choose to drink alcohol, please do not consume more than  2 drinks per day. One drink is considered to be 12 ounces (355 mL) of beer, 5 ounces (148 mL) of wine, or 1.5 ounces (44 mL) of liquor.  . If you are 54-1 years old, ask your health care provider if you should take aspirin to prevent strokes.  . Use sunscreen. Apply sunscreen liberally and repeatedly throughout the day. You should seek shade when your shadow is shorter than you. Protect yourself by wearing long sleeves, pants, a wide-brimmed hat, and sunglasses year round, whenever you are outdoors.  . Once a month, do a whole body skin exam, using a mirror to look at the skin on your back. Tell your health care provider of new moles, moles that have irregular borders, moles that are larger than a pencil eraser, or moles that have changed in shape or color.

## 2018-12-18 NOTE — Progress Notes (Signed)
Subjective:   Melissa Blackwell is a 72 y.o. female who presents for Medicare Annual (Subsequent) preventive examination.  Review of Systems:  N/A Cardiac Risk Factors include: advanced age (>91men, >89 women);dyslipidemia     Objective:     Vitals: BP 118/76 (BP Location: Right Arm, Patient Position: Sitting, Cuff Size: Normal)   Pulse 67   Temp 98 F (36.7 C) (Oral)   Ht 5\' 3"  (1.6 m) Comment: no shoes  Wt 134 lb 12 oz (61.1 kg)   SpO2 98%   BMI 23.87 kg/m   Body mass index is 23.87 kg/m.  Advanced Directives 12/18/2018 11/12/2016  Does Patient Have a Medical Advance Directive? Yes Yes  Type of Paramedic of [place];Living will [place];Living will  Copy of [place] in Chart? No - copy requested No - copy requested    Tobacco Social History   Tobacco Use  Smoking Status Never Smoker  Smokeless Tobacco Never Used     Counseling given: No   Clinical Intake:  Pre-visit preparation completed: Yes  Pain : No/denies pain Pain Score: 0-No pain     Nutritional Status: BMI of 19-24  Normal Nutritional Risks: None Diabetes: No  How often do you need to have someone help you when you read instructions, pamphlets, or other written materials from your doctor or pharmacy?: 1 - Never What is the last grade level you completed in school?: 12th grade + 1 yr college  Interpreter Needed?: No  Comments: pt lives with spouse Information entered by :: LPinson, LPN  Past Medical History:  Diagnosis Date  . Cancer ([place])    skin CA Hx of squamous cell in situ  . Diverticulosis of sigmoid colon    mild  . GI bleed   . Hx of adenomatous colonic polyps   . Hyperlipidemia   . Internal hemorrhoids   . Lyme disease 06/2007   Dr Knox Royalty  . Osteopenia   . Renal cyst    right renal cyst   Past Surgical History:  Procedure Laterality Date  . ABDOMINAL HYSTERECTOMY  03/1996   partial fibroids  . hemorrhoid  banding    . HEMORROIDECTOMY  04/2001  . RHINOPLASTY    . scar removal     keloid  scar  . SPINE SURGERY     spinal fusion   Family History  Problem Relation Age of Onset  . Cancer Mother        pancreatic CA  . Heart disease Mother   . Heart disease Father        CHF and heart problems  . Hyperlipidemia Sister   . Cancer Sister        uterine CA  . Heart disease Maternal Grandmother    Social History   Socioeconomic History  . Marital status: Married    Spouse name: Not on file  . Number of children: Not on file  . Years of education: Not on file  . Highest education level: Not on file  Occupational History  . Not on file  Social Needs  . Financial resource strain: Not on file  . Food insecurity:    Worry: Not on file    Inability: Not on file  . Transportation needs:    Medical: Not on file    Non-medical: Not on file  Tobacco Use  . Smoking status: Never Smoker  . Smokeless tobacco: Never Used  Substance and Sexual Activity  . Alcohol use:  Yes    Alcohol/week: 0.0 standard drinks    Comment: rarely - twice a year  . Drug use: No  . Sexual activity: Not on file  Lifestyle  . Physical activity:    Days per week: Not on file    Minutes per session: Not on file  . Stress: Not on file  Relationships  . Social connections:    Talks on phone: Not on file    Gets together: Not on file    Attends religious service: Not on file    Active member of club or organization: Not on file    Attends meetings of clubs or organizations: Not on file    Relationship status: Not on file  Other Topics Concern  . Not on file  Social History Narrative  . Not on file    Outpatient Encounter Medications as of 12/18/2018  Medication Sig  . Acetylcysteine (N-ACETYL-L-CYSTEINE) 600 MG CAPS Take 1 tablet by mouth daily.  . calcium carbonate (OS-CAL) 600 MG TABS Take 600 mg by mouth 2 (two) times daily with a meal.    . Cholecalciferol (VITAMIN D3) 5000 UNITS CAPS Take 5,000  Units by mouth daily.  . L-ARGININE PO Take 1 capsule by mouth daily.  . L-Glutamine POWD Take 2 Scoops by mouth daily.  . Misc Natural Products (TURMERIC CURCUMIN) CAPS Take 1 capsule by mouth daily.  . Multiple Vitamin (MULTIVITAMIN) capsule Take 1 capsule by mouth daily.  . NON FORMULARY Cowden herbal protocol for lyme disease as directed   . Probiotic Product (PROBIOTIC PO) Take 1 capsule by mouth daily.    . Red Yeast Rice Extract 600 MG CAPS Take 1 capsule by mouth 2 (two) times daily.    . [DISCONTINUED] Chlorophyllin Copper Cmplx Sod POWD Take 1 capsule by mouth daily.   . [DISCONTINUED] Omega-3 Fatty Acids (OMEGA 3 PO) Take 1 capsule by mouth daily.  . [DISCONTINUED] Resveratrol 250 MG CAPS Take 1 capsule by mouth daily.    No facility-administered encounter medications on file as of 12/18/2018.     Activities of Daily Living In your present state of health, do you have any difficulty performing the following activities: 12/18/2018  Hearing? N  Vision? N  Difficulty concentrating or making decisions? N  Walking or climbing stairs? N  Dressing or bathing? N  Doing errands, shopping? N  Preparing Food and eating ? N  Using the Toilet? N  In the past six months, have you accidently leaked urine? N  Do you have problems with loss of bowel control? N  Managing your Medications? N  Managing your Finances? N  Housekeeping or managing your Housekeeping? N  Some recent data might be hidden    Patient Care Team: Tower, Wynelle Fanny, MD as PCP - General    Assessment:   This is a routine wellness examination for Melissa Blackwell.   Hearing Screening   125Hz  250Hz  500Hz  1000Hz  2000Hz  3000Hz  4000Hz  6000Hz  8000Hz   Right ear:   40 40 40  0    Left ear:   40 40 40  0    Vision Screening Comments: Vision exam in August 2019 @ [facility] Activities and Dietary recommendations Current Exercise Habits: Home exercise routine, Type of exercise: Other - see  comments(gym), Time (Minutes): 50, Frequency (Times/Week): 3, Weekly Exercise (Minutes/Week): 150, Intensity: Moderate, Exercise limited by: None identified  Goals    . Increase physical activity     Starting 12/18/2018, I will continue  to exercise at least 50 min 2-3 days per week.        Fall Risk Fall Risk  12/18/2018 12/10/2017 11/28/2016 11/12/2016 11/13/2015  Falls in the past year? 0 No No No No  Comment - - Emmi Telephone Survey: data to providers prior to load - -   Depression Screen PHQ 2/9 Scores 12/18/2018 12/10/2017 11/12/2016 11/13/2015  PHQ - 2 Score 0 0 0 0  PHQ- 9 Score 0 - - -     Cognitive Function MMSE - Mini Mental State Exam 12/18/2018 11/12/2016  Orientation to time 5 5  Orientation to Place 5 5  Registration 3 3  Attention/ Calculation 0 0  Recall 2 3  Recall-comments unable to recall 1 of 3 words -  Language- name 2 objects 0 0  Language- repeat 1 1  Language- follow 3 step command 3 3  Language- read & follow direction 0 0  Write a sentence 0 0  Copy design 0 0  Total score 19 20     PLEASE NOTE: A Mini-Cog screen was completed. Maximum score is 20. A value of 0 denotes this part of [place] MMSE was not completed or the patient failed this part of the Mini-Cog screening.   Mini-Cog Screening Orientation to Time - Max 5 pts Orientation to Place - Max 5 pts Registration - Max 3 pts Recall - Max 3 pts Language Repeat - Max 1 pts Language Follow 3 Step Command - Max 3 pts     Immunization History  Administered Date(s) Administered  . Td 03/29/2003, 10/27/2013  . Zoster 11/29/2015    Screening Tests Health Maintenance  Topic Date Due  . INFLUENZA VACCINE  01/24/2021 (Originally 07/23/2018)  . PNA vac Low Risk Adult (1 of 2 - PCV13) 01/24/2021 (Originally 02/28/2010)  . MAMMOGRAM  05/15/2019  . COLONOSCOPY  10/05/2020  . TETANUS/TDAP  10/28/2023  . DEXA SCAN  Completed  . Hepatitis C Screening  Completed      Plan:     I have  personally reviewed, addressed, and noted the following in the patient's chart:  A. Medical and social history B. Use of alcohol, tobacco or illicit drugs  C. Current medications and supplements D. Functional ability and status E.  Nutritional status F.  Physical activity G. Advance directives H. List of other physicians I.  Hospitalizations, surgeries, and ER visits in previous 12 months J.  [place] to include hearing, vision, cognitive, depression L. Referrals and appointments - none  In addition, I have reviewed and discussed with patient certain preventive protocols, quality metrics, and best practice recommendations. A written personalized care plan for preventive services as well as general preventive health recommendations were provided to patient.  See attached scanned questionnaire for additional information.   Signed,   Lindell Noe, MHA, BS, LPN Health Coach

## 2018-12-25 ENCOUNTER — Encounter: Payer: Self-pay | Admitting: Family Medicine

## 2018-12-25 ENCOUNTER — Ambulatory Visit (INDEPENDENT_AMBULATORY_CARE_PROVIDER_SITE_OTHER): Payer: Medicare Other | Admitting: Family Medicine

## 2018-12-25 VITALS — BP 122/68 | HR 67 | Temp 98.2°F | Ht 63.0 in | Wt 136.0 lb

## 2018-12-25 DIAGNOSIS — M8589 Other specified disorders of bone density and structure, multiple sites: Secondary | ICD-10-CM | POA: Diagnosis not present

## 2018-12-25 DIAGNOSIS — R5382 Chronic fatigue, unspecified: Secondary | ICD-10-CM

## 2018-12-25 DIAGNOSIS — E559 Vitamin D deficiency, unspecified: Secondary | ICD-10-CM

## 2018-12-25 DIAGNOSIS — E7800 Pure hypercholesterolemia, unspecified: Secondary | ICD-10-CM | POA: Diagnosis not present

## 2018-12-25 NOTE — Progress Notes (Signed)
Subjective:    Patient ID: Melissa Blackwell, female    DOB: 1946-10-09, 73 y.o.   MRN: [id number]  HPI Here for annual f/u of chronic health problems  As busy as she wants to be  Taking care of herself    Wt Readings from Last 3 Encounters:  12/25/18 136 lb (61.7 kg)  12/18/18 134 lb 12 oz (61.1 kg)  12/10/17 136 lb 8 oz (61.9 kg)  very good weight - good  Exercise- gym 3 days per week / now going even more with husband now also  Gets out- walking the dog  24.09 kg/m   Had amw 12/27 Missed highest tone in both ears (she does not notice- no problems)  Unable to recall 1 of 3 words on mini cog  No issues at home that she notices  No confusion or episodes of getting lost   Declines immunizations -flu and pneumonia  Had zostavax 12/16 Is interested in shingrix    Mammogram-neg in May Self breast exam -no lumps   Colonoscopy 10/11 with 10 y recall   dexa 3/18  Osteopenia-some improvement  D level is 62.9 No falls or fx Continues to exercise  Also works on balance -does a balance activity at the gym (one leg at a time)  Chronic fatigue from past lyme dz Thinks she is in remission /holding the cowden protocol  Tumeric helps OA in R knee   Hyperlipidemia Lab Results  Component Value Date   CHOL 257 (H) 12/18/2018   CHOL 233 (H) 12/03/2017   CHOL 255 (H) 11/12/2016   Lab Results  Component Value Date   HDL 75.80 12/18/2018   HDL 77.40 12/03/2017   HDL 82.70 11/12/2016   Lab Results  Component Value Date   LDLCALC 160 (H) 12/18/2018   LDLCALC 139 (H) 12/03/2017   LDLCALC 156 (H) 11/12/2016   Lab Results  Component Value Date   TRIG 107.0 12/18/2018   TRIG 79.0 12/03/2017   TRIG 81.0 11/12/2016   Lab Results  Component Value Date   CHOLHDL 3 12/18/2018   CHOLHDL 3 12/03/2017   CHOLHDL 3 11/12/2016   Lab Results  Component Value Date   LDLDIRECT 124.9 10/14/2013   LDLDIRECT 148.5 10/07/2012   LDLDIRECT 129.0 10/10/2011  LDL up to 160 Has been  eating poorly  Not a lot of fried foods Eats a lot of cheese Breakfast -sausage /egg [name]  Declines medication of any kind   Lab Results  Component Value Date   WBC 5.4 12/18/2018   HGB 13.4 12/18/2018   HCT 40.1 12/18/2018   MCV 87.1 12/18/2018   PLT 202.0 12/18/2018    Lab Results  Component Value Date   CREATININE 0.78 12/18/2018   BUN 18 12/18/2018   NA 140 12/18/2018   K 4.7 12/18/2018   CL 104 12/18/2018   CO2 27 12/18/2018   Lab Results  Component Value Date   ALT 16 12/18/2018   AST 21 12/18/2018   ALKPHOS 63 12/18/2018   BILITOT 0.4 12/18/2018    Lab Results  Component Value Date   TSH 1.53 12/18/2018     Patient Active Problem List   Diagnosis Date Noted  . Estrogen deficiency 11/20/2016  . Fatigue 11/05/2015  . Vitamin D deficiency 10/31/2014  . Hyperlipidemia 10/31/2014  . Hyperkalemia 10/27/2013  . Encounter for Medicare annual wellness exam 10/13/2013  . Other screening mammogram 10/15/2011  . DIVERTICULOSIS, COLON 08/23/2010  . COLONIC POLYPS, ADENOMATOUS, HX OF 08/23/2010  .  UNSPEC SYMPTOM ASSOC W/FEMALE GENITAL ORGANS 08/11/2009  . LYME DISEASE 08/10/2007  . Hemorrhoids 07/21/2007  . CONSTIPATION, CHRONIC 07/21/2007  . Osteopenia 07/21/2007  . RHEUMATOID FACTOR, POSITIVE 07/21/2007  . SKIN CANCER, HX OF 07/21/2007   Past Medical History:  Diagnosis Date  . Cancer ([place])    skin CA Hx of squamous cell in situ  . Diverticulosis of sigmoid colon    mild  . GI bleed   . Hx of adenomatous colonic polyps   . Hyperlipidemia   . Internal hemorrhoids   . Lyme disease 06/2007   Dr Knox Royalty  . Osteopenia   . Renal cyst    right renal cyst   Past Surgical History:  Procedure Laterality Date  . ABDOMINAL HYSTERECTOMY  03/1996   partial fibroids  . hemorrhoid banding    . HEMORROIDECTOMY  04/2001  . RHINOPLASTY    . scar removal     keloid  scar  . SPINE SURGERY     spinal fusion   Social History   Tobacco Use  . Smoking status:  Never Smoker  . Smokeless tobacco: Never Used  Substance Use Topics  . Alcohol use: Yes    Alcohol/week: 0.0 standard drinks    Comment: rarely - twice a year  . Drug use: No   Family History  Problem Relation Age of Onset  . Cancer Mother        pancreatic CA  . Heart disease Mother   . Heart disease Father        CHF and heart problems  . Hyperlipidemia Sister   . Cancer Sister        uterine CA  . Heart disease Maternal Grandmother    No Known Allergies Current Outpatient Medications on File Prior to Visit  Medication Sig Dispense Refill  . Acetylcysteine (N-ACETYL-L-CYSTEINE) 600 MG CAPS Take 1 tablet by mouth daily.    . calcium carbonate (OS-CAL) 600 MG TABS Take 600 mg by mouth 2 (two) times daily with a meal.      . Cholecalciferol (VITAMIN D3) 5000 UNITS CAPS Take 5,000 Units by mouth daily.    . L-ARGININE PO Take 1 capsule by mouth daily.    . L-Glutamine POWD Take 2 Scoops by mouth daily.    . Misc Natural Products (TURMERIC CURCUMIN) CAPS Take 1 capsule by mouth daily.    . Multiple Vitamin (MULTIVITAMIN) capsule Take 1 capsule by mouth daily.    . NON FORMULARY Cowden herbal protocol for lyme disease as directed     . Probiotic Product (PROBIOTIC PO) Take 1 capsule by mouth daily.      . Red Yeast Rice Extract 600 MG CAPS Take 1 capsule by mouth 2 (two) times daily.       No current facility-administered medications on file prior to visit.     Review of Systems  Constitutional: Positive for fatigue. Negative for activity change, appetite change, fever and unexpected weight change.  HENT: Negative for congestion, ear pain, rhinorrhea, sinus pressure and sore throat.   Eyes: Negative for pain, redness and visual disturbance.  Respiratory: Negative for cough, shortness of breath and wheezing.   Cardiovascular: Negative for chest pain and palpitations.  Gastrointestinal: Negative for abdominal pain, blood in stool, constipation and diarrhea.  Endocrine: Negative  for polydipsia and polyuria.  Genitourinary: Negative for dysuria, frequency and urgency.  Musculoskeletal: Positive for arthralgias. Negative for back pain and myalgias.  Skin: Negative for pallor and rash.  Allergic/Immunologic: Negative for environmental  allergies.  Neurological: Negative for dizziness, syncope and headaches.  Hematological: Negative for adenopathy. Does not bruise/bleed easily.  Psychiatric/Behavioral: Negative for decreased concentration and dysphoric mood. The patient is not nervous/anxious.        Objective:   Physical Exam Constitutional:      General: She is not in acute distress.    Appearance: Normal appearance. She is well-developed and normal weight. She is not ill-appearing.  HENT:     Head: Normocephalic and atraumatic.     Right Ear: Tympanic membrane, ear canal and external ear normal.     Left Ear: Tympanic membrane, ear canal and external ear normal.     Nose: Nose normal.     Mouth/Throat:     Mouth: Mucous membranes are moist.     Pharynx: Oropharynx is clear.  Eyes:     General: No scleral icterus.    Conjunctiva/sclera: Conjunctivae normal.     Pupils: Pupils are equal, round, and reactive to light.  Neck:     Musculoskeletal: Normal range of motion and neck supple.     Thyroid: No thyromegaly.     Vascular: No carotid bruit or JVD.  Cardiovascular:     Rate and Rhythm: Normal rate and regular rhythm.     Heart sounds: Normal heart sounds. No gallop.   Pulmonary:     Effort: Pulmonary effort is normal. No respiratory distress.     Breath sounds: Normal breath sounds. No wheezing.  Chest:     Chest wall: No tenderness.  Abdominal:     General: Bowel sounds are normal. There is no distension or abdominal bruit.     Palpations: Abdomen is soft. There is no mass.     Tenderness: There is no abdominal tenderness.  Genitourinary:    Comments: Breast exam: No mass, nodules, thickening, tenderness, bulging, retraction, inflamation, nipple  discharge or skin changes noted.  No axillary or clavicular LA.     Musculoskeletal: Normal range of motion.        General: No swelling, tenderness or deformity.     Right lower leg: No edema.     Left lower leg: No edema.  Lymphadenopathy:     Cervical: No cervical adenopathy.  Skin:    General: Skin is warm and dry.     Capillary Refill: Capillary refill takes less than 2 seconds.     Coloration: Skin is not pale.     Findings: No erythema or rash.     Comments: Solar lentigines diffusely   Neurological:     General: No focal deficit present.     Mental Status: She is alert. Mental status is at baseline.     Cranial Nerves: No cranial nerve deficit.     Motor: No abnormal muscle tone.     Coordination: Coordination normal.     Deep Tendon Reflexes: Reflexes are normal and symmetric. Reflexes normal.  Psychiatric:        Mood and Affect: Mood normal.           Assessment & Plan:   Problem List Items Addressed This Visit      Musculoskeletal and Integument   Osteopenia    dexa 2018 - improved  D level in 60s  No falls or fx  Disc need for calcium/ vitamin D/ wt bearing exercise and bone density test every 2 y to monitor Disc safety/ fracture risk in detail          Other   Vitamin D deficiency  D level in 60s Vitamin D level is therapeutic with current supplementation Disc importance of this to bone and overall health       Hyperlipidemia - Primary    Disc goals for lipids and reasons to control them Rev last labs with pt Rev low sat fat diet in detail  LDL up to 160s-pt refuses medication of any kind unfortunately      Fatigue    Due to chronic lyme dz  Is overall improved and no longer on Cowden protocol of tx  Labs are stable

## 2018-12-25 NOTE — Patient Instructions (Addendum)
If you are interested in the new shingles vaccine (Shingrix) - call your local pharmacy to check on coverage and availability  If affordable, get on a wait list at your pharmacy to get the vaccine.  For cholesterol  Avoid red meat/ fried foods/ egg yolks/ fatty breakfast meats/ butter, cheese and high fat dairy/ and shellfish   If you change your mind about cholesterol medication in the future let us know   Keep taking great care of yourself  Keep going to the dermatologist

## 2018-12-27 NOTE — Assessment & Plan Note (Addendum)
Disc goals for lipids and reasons to control them Rev last labs with pt Rev low sat fat diet in detail  LDL up to 160s-pt refuses medication of any kind unfortunately

## 2018-12-27 NOTE — Assessment & Plan Note (Signed)
Due to chronic lyme dz  Is overall improved and no longer on Cowden protocol of tx  Labs are stable

## 2018-12-27 NOTE — Assessment & Plan Note (Signed)
D level in 60s Vitamin D level is therapeutic with current supplementation Disc importance of this to bone and overall health

## 2018-12-27 NOTE — Assessment & Plan Note (Signed)
dexa 2018 - improved  D level in 60s  No falls or fx  Disc need for calcium/ vitamin D/ wt bearing exercise and bone density test every 2 y to monitor Disc safety/ fracture risk in detail

## 2019-01-13 ENCOUNTER — Telehealth: Payer: Self-pay | Admitting: *Deleted

## 2019-01-13 MED ORDER — ZOSTER VAC RECOMB ADJUVANTED 50 MCG/0.5ML IM SUSR: 0.5000 mL | Freq: Once | INTRAMUSCULAR | 1 refills | Status: AC

## 2019-01-13 NOTE — Telephone Encounter (Signed)
Pt notified vaccine Rx sent

## 2019-01-13 NOTE — Telephone Encounter (Signed)
Spoke to pt who states she is needing Rx sent to [facility] in [place] for both doses of shingrix. pls advise

## 2019-01-13 NOTE — Telephone Encounter (Signed)
I sent a px (with refill for 2nd dose)

## 2019-06-24 DIAGNOSIS — M1711 Unilateral primary osteoarthritis, right knee: Secondary | ICD-10-CM | POA: Diagnosis not present

## 2019-06-24 DIAGNOSIS — M25561 Pain in right knee: Secondary | ICD-10-CM | POA: Diagnosis not present

## 2019-08-06 ENCOUNTER — Other Ambulatory Visit: Payer: Self-pay | Admitting: Family Medicine

## 2019-08-06 DIAGNOSIS — Z1231 Encounter for screening mammogram for malignant neoplasm of breast: Secondary | ICD-10-CM

## 2019-08-10 DIAGNOSIS — E041 Nontoxic single thyroid nodule: Secondary | ICD-10-CM | POA: Diagnosis not present

## 2019-08-10 DIAGNOSIS — R490 Dysphonia: Secondary | ICD-10-CM | POA: Diagnosis not present

## 2019-08-19 ENCOUNTER — Other Ambulatory Visit: Payer: Self-pay

## 2019-08-19 ENCOUNTER — Ambulatory Visit
Admission: RE | Admit: 2019-08-19 | Discharge: 2019-08-19 | Disposition: A | Discharge status: Home / Self Care | Payer: Medicare Other | Source: Ambulatory Visit | Attending: Family Medicine | Admitting: Family Medicine

## 2019-08-19 DIAGNOSIS — Z1231 Encounter for screening mammogram for malignant neoplasm of breast: Secondary | ICD-10-CM

## 2019-08-30 IMAGING — MG DIGITAL SCREENING BILATERAL MAMMOGRAM WITH TOMO AND CAD
8 series · 9 of 24 positions shown · non-contrast
Comparison: Previous exam(s).

CLINICAL DATA: Screening.

EXAM:
DIGITAL SCREENING BILATERAL MAMMOGRAM WITH TOMO AND CAD

[R MLO synth-2D]
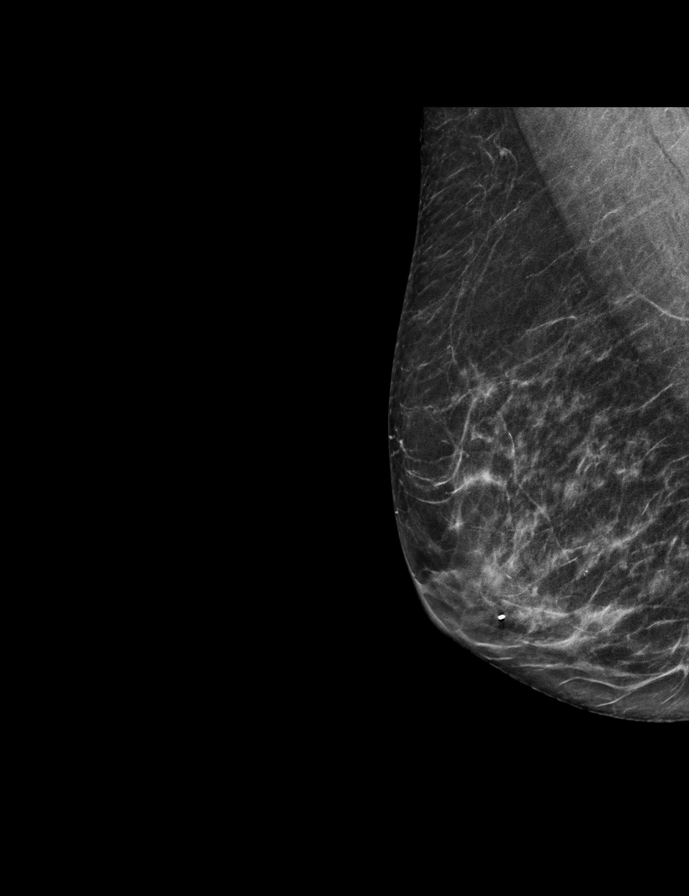

[L CC synth-2D]
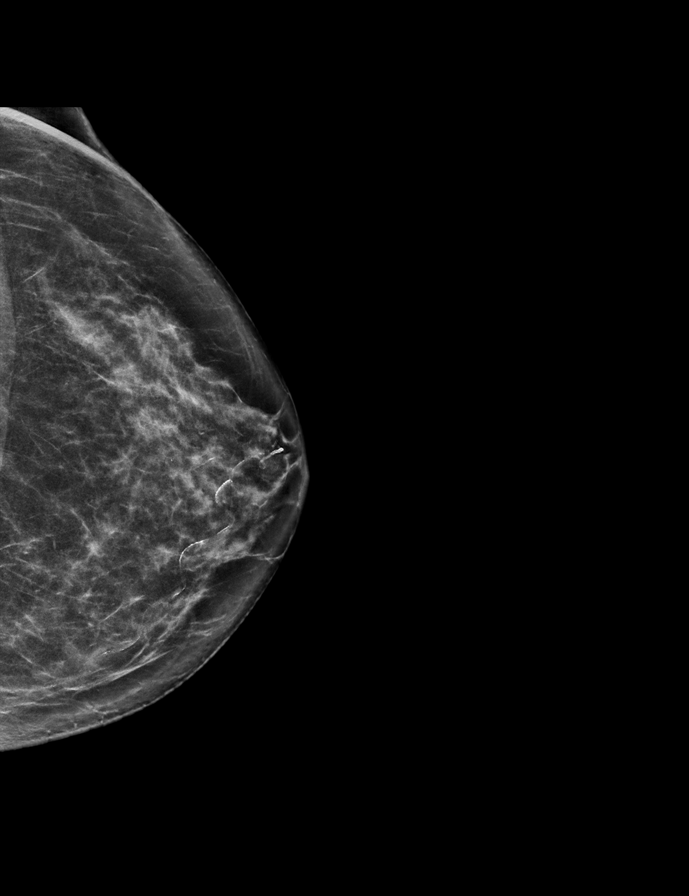

[L MLO synth-2D]
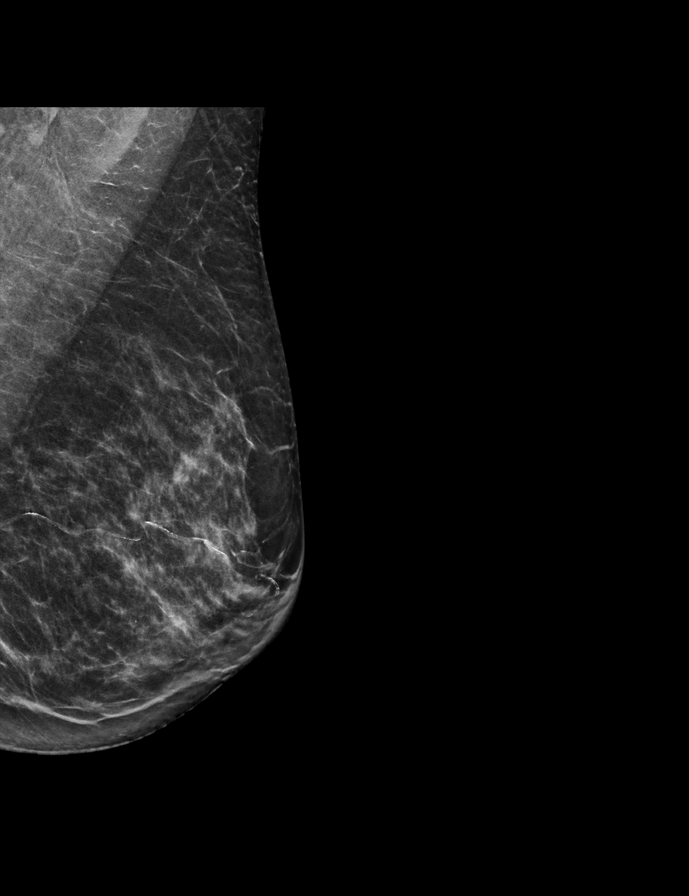

[R CC synth-2D]
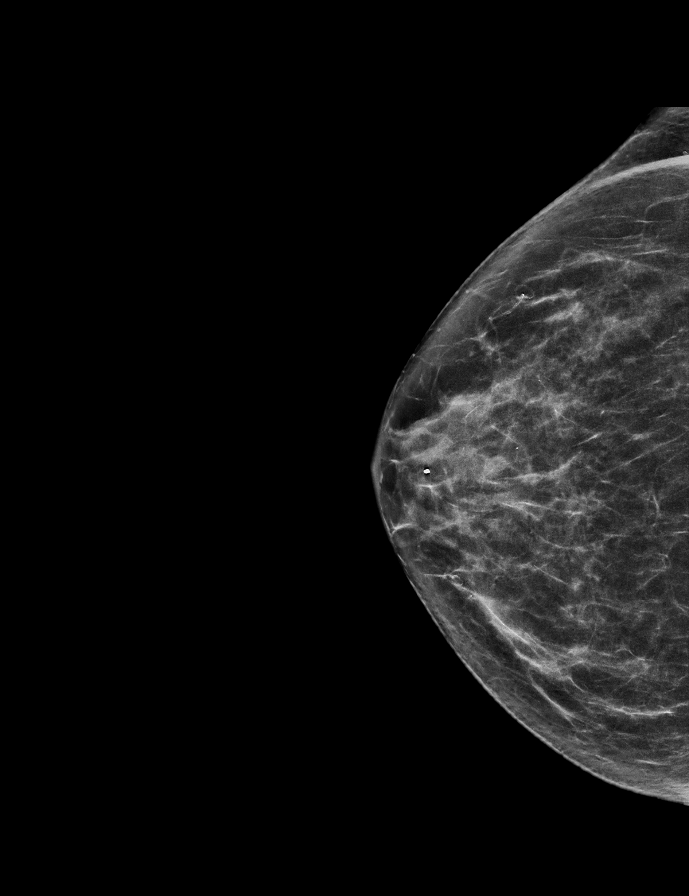

[R MLO tomo · 2 of 55 frames shown]
[frame 18/55]
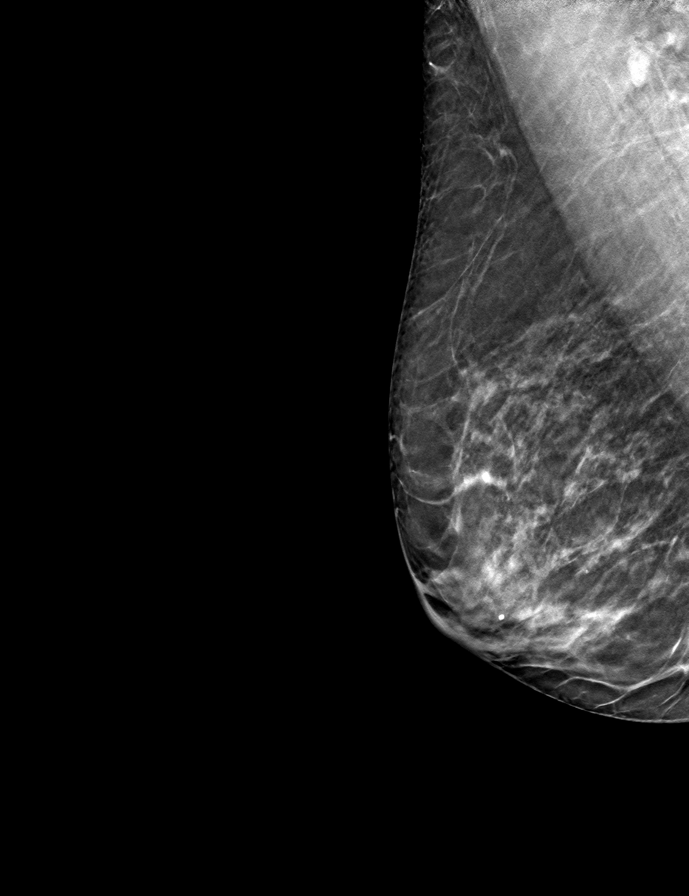
[frame 28/55]
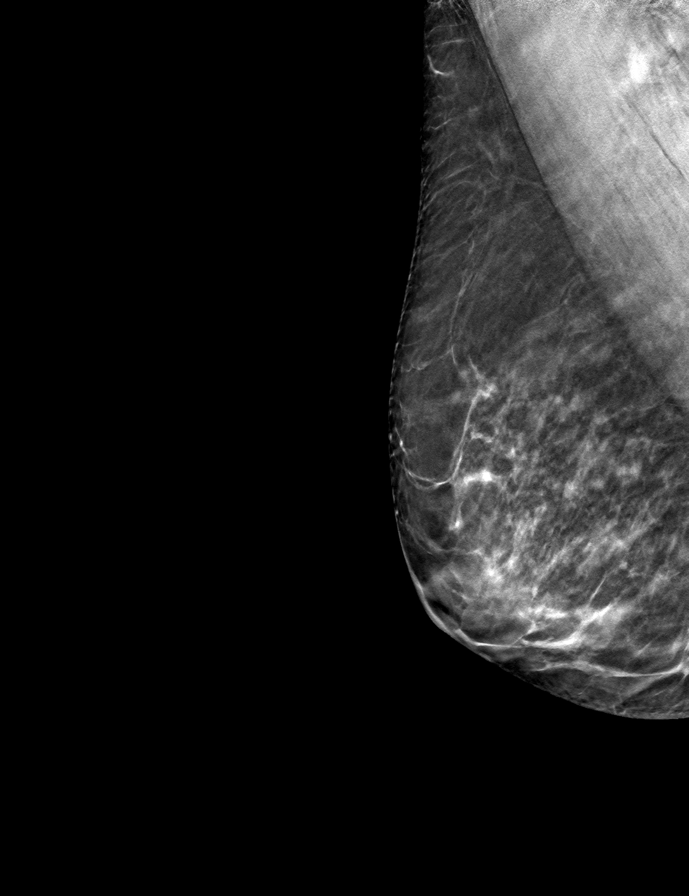

[R CC tomo · tomo slice 30/59.0]
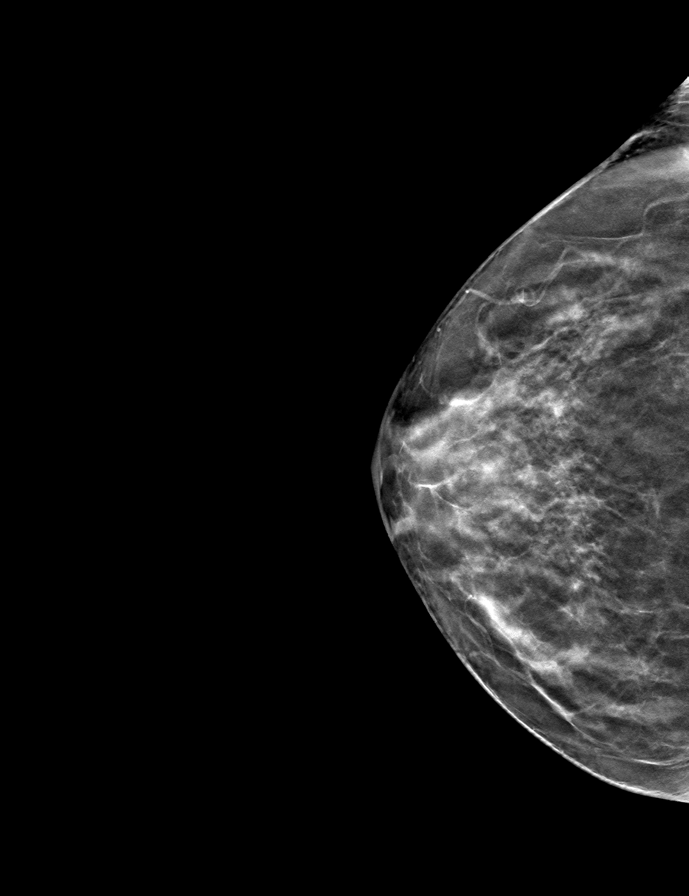

[L CC tomo · tomo slice 33/65.0]
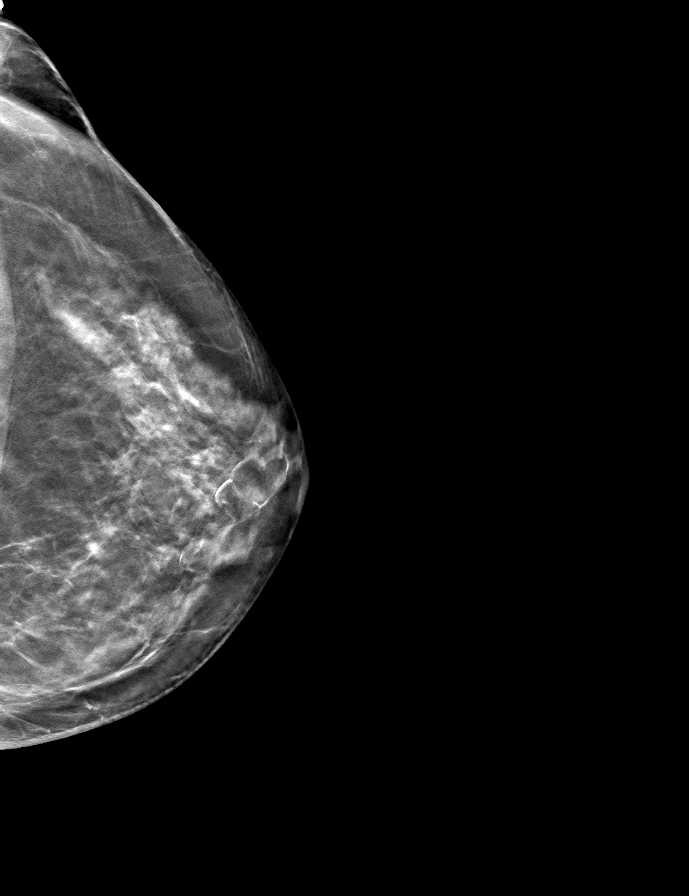

[L MLO tomo · tomo slice 29/56.0]
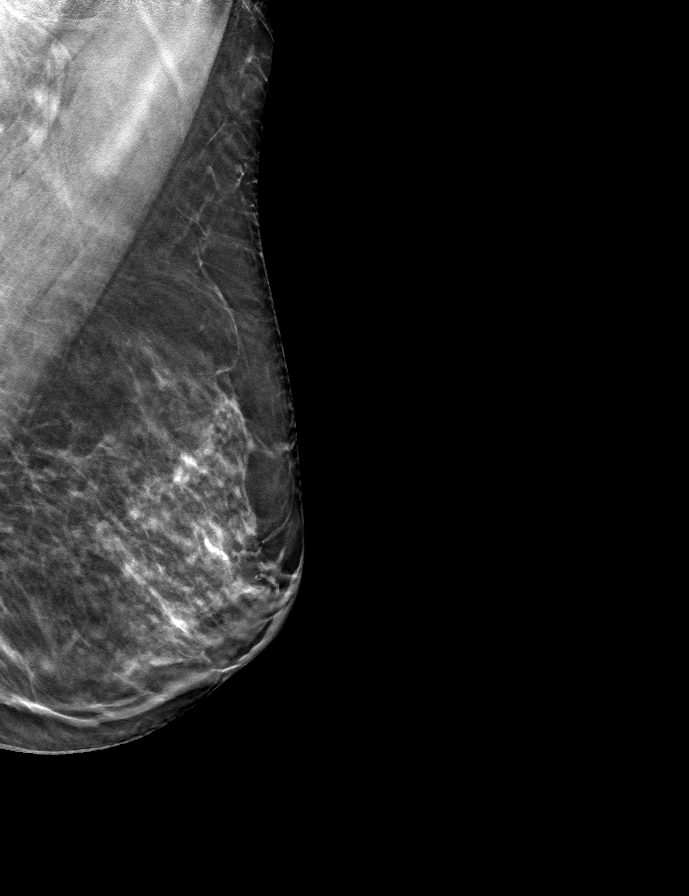

[9 of 24 positions shown; findings below may reference images not displayed]

ACR Breast Density Category c: The breast tissue is heterogeneously
dense, which may obscure small masses.
FINDINGS: There are no findings suspicious for malignancy. Images were
processed with CAD.
IMPRESSION: No mammographic evidence of malignancy. A result letter of this
screening mammogram will be mailed directly to the patient.

RECOMMENDATION:
Screening mammogram in one year. (Code:[id number])

BI-RADS CATEGORY  1: Negative.

## 2019-09-06 DIAGNOSIS — E041 Nontoxic single thyroid nodule: Secondary | ICD-10-CM | POA: Diagnosis not present

## 2019-09-21 DIAGNOSIS — Z85828 Personal history of other malignant neoplasm of skin: Secondary | ICD-10-CM | POA: Diagnosis not present

## 2019-09-21 DIAGNOSIS — B009 Herpesviral infection, unspecified: Secondary | ICD-10-CM | POA: Diagnosis not present

## 2019-09-21 DIAGNOSIS — L905 Scar conditions and fibrosis of skin: Secondary | ICD-10-CM | POA: Diagnosis not present

## 2019-09-21 DIAGNOSIS — L821 Other seborrheic keratosis: Secondary | ICD-10-CM | POA: Diagnosis not present

## 2019-09-21 DIAGNOSIS — D1801 Hemangioma of skin and subcutaneous tissue: Secondary | ICD-10-CM | POA: Diagnosis not present

## 2019-11-16 DIAGNOSIS — H40023 Open angle with borderline findings, high risk, bilateral: Secondary | ICD-10-CM | POA: Diagnosis not present

## 2019-11-16 DIAGNOSIS — H2513 Age-related nuclear cataract, bilateral: Secondary | ICD-10-CM | POA: Diagnosis not present

## 2019-11-17 ENCOUNTER — Other Ambulatory Visit: Payer: Self-pay

## 2019-12-26 ENCOUNTER — Telehealth: Payer: Self-pay | Admitting: Family Medicine

## 2019-12-26 DIAGNOSIS — E875 Hyperkalemia: Secondary | ICD-10-CM

## 2019-12-26 DIAGNOSIS — E7800 Pure hypercholesterolemia, unspecified: Secondary | ICD-10-CM

## 2019-12-26 DIAGNOSIS — R5382 Chronic fatigue, unspecified: Secondary | ICD-10-CM

## 2019-12-26 DIAGNOSIS — E559 Vitamin D deficiency, unspecified: Secondary | ICD-10-CM

## 2019-12-26 NOTE — Telephone Encounter (Signed)
-----   Message from Ellamae Sia sent at 12/20/2019 11:24 AM EST ----- Regarding: Lab orders for Monday, 1.4.21  AWV lab orders, please.

## 2019-12-27 ENCOUNTER — Ambulatory Visit: Payer: Medicare Other

## 2019-12-27 ENCOUNTER — Other Ambulatory Visit (INDEPENDENT_AMBULATORY_CARE_PROVIDER_SITE_OTHER): Payer: Medicare Other

## 2019-12-27 DIAGNOSIS — E7800 Pure hypercholesterolemia, unspecified: Secondary | ICD-10-CM

## 2019-12-27 DIAGNOSIS — E559 Vitamin D deficiency, unspecified: Secondary | ICD-10-CM | POA: Diagnosis not present

## 2019-12-27 DIAGNOSIS — R5382 Chronic fatigue, unspecified: Secondary | ICD-10-CM | POA: Diagnosis not present

## 2019-12-27 DIAGNOSIS — E875 Hyperkalemia: Secondary | ICD-10-CM | POA: Diagnosis not present

## 2019-12-27 LAB — CBC WITH DIFFERENTIAL/PLATELET
Basophils Absolute: 0.1 10*3/uL (ref 0.0–0.1)
Basophils Relative: 1.2 % (ref 0.0–3.0)
Eosinophils Absolute: 0.1 10*3/uL (ref 0.0–0.7)
Eosinophils Relative: 1.5 % (ref 0.0–5.0)
HCT: 40.7 % (ref 36.0–46.0)
Hemoglobin: 13.1 g/dL (ref 12.0–15.0)
Lymphocytes Relative: 27.6 % (ref 12.0–46.0)
Lymphs Abs: 1.2 10*3/uL (ref 0.7–4.0)
MCHC: 32.3 g/dL (ref 30.0–36.0)
MCV: 89.5 fl (ref 78.0–100.0)
Monocytes Absolute: 0.4 10*3/uL (ref 0.1–1.0)
Monocytes Relative: 8.3 % (ref 3.0–12.0)
Neutro Abs: 2.7 10*3/uL (ref 1.4–7.7)
Neutrophils Relative %: 61.4 % (ref 43.0–77.0)
Platelets: 187 10*3/uL (ref 150.0–400.0)
RBC: 4.55 Mil/uL (ref 3.87–5.11)
RDW: 14.1 % (ref 11.5–15.5)
WBC: 4.4 10*3/uL (ref 4.0–10.5)

## 2019-12-27 LAB — VITAMIN D 25 HYDROXY (VIT D DEFICIENCY, FRACTURES): VITD: 65.95 ng/mL (ref 30.00–100.00)

## 2019-12-27 LAB — LIPID PANEL
Cholesterol: 250 mg/dL — ABNORMAL HIGH (ref 0–200)
HDL: 68.6 mg/dL (ref >39.00)
LDL Cholesterol: 160 mg/dL — ABNORMAL HIGH (ref 0–99)
NonHDL: 181.83
Total CHOL/HDL Ratio: 4
Triglycerides: 110 mg/dL (ref 0.0–149.0)
VLDL: 22 mg/dL (ref 0.0–40.0)

## 2019-12-27 LAB — COMPREHENSIVE METABOLIC PANEL
ALT: 17 U/L (ref 0–35)
AST: 20 U/L (ref 0–37)
Albumin: 4.4 g/dL (ref 3.5–5.2)
Alkaline Phosphatase: 67 U/L (ref 39–117)
BUN: 17 mg/dL (ref 6–23)
CO2: 29 mEq/L (ref 19–32)
Calcium: 9.8 mg/dL (ref 8.4–10.5)
Chloride: 104 mEq/L (ref 96–112)
Creatinine, Ser: 0.7 mg/dL (ref 0.40–1.20)
GFR: 81.61 mL/min (ref >60.00)
Glucose, Bld: 95 mg/dL (ref 70–99)
Potassium: 4.5 mEq/L (ref 3.5–5.1)
Sodium: 141 mEq/L (ref 135–145)
Total Bilirubin: 0.4 mg/dL (ref 0.2–1.2)
Total Protein: 7 g/dL (ref 6.0–8.3)

## 2019-12-27 LAB — TSH: TSH: 1.34 u[IU]/mL (ref 0.35–4.50)

## 2019-12-31 ENCOUNTER — Encounter: Payer: Medicare Other | Admitting: Family Medicine

## 2020-01-04 ENCOUNTER — Encounter: Payer: Self-pay | Admitting: Family Medicine

## 2020-01-04 ENCOUNTER — Ambulatory Visit (INDEPENDENT_AMBULATORY_CARE_PROVIDER_SITE_OTHER): Payer: Medicare Other | Admitting: Family Medicine

## 2020-01-04 ENCOUNTER — Other Ambulatory Visit: Payer: Self-pay

## 2020-01-04 VITALS — BP 122/68 | HR 72 | Temp 97.2°F | Ht 63.0 in | Wt 128.4 lb

## 2020-01-04 DIAGNOSIS — M8589 Other specified disorders of bone density and structure, multiple sites: Secondary | ICD-10-CM | POA: Diagnosis not present

## 2020-01-04 DIAGNOSIS — A6920 Lyme disease, unspecified: Secondary | ICD-10-CM | POA: Diagnosis not present

## 2020-01-04 DIAGNOSIS — Z0000 Encounter for general adult medical examination without abnormal findings: Secondary | ICD-10-CM | POA: Diagnosis not present

## 2020-01-04 DIAGNOSIS — E559 Vitamin D deficiency, unspecified: Secondary | ICD-10-CM | POA: Diagnosis not present

## 2020-01-04 DIAGNOSIS — E7800 Pure hypercholesterolemia, unspecified: Secondary | ICD-10-CM | POA: Diagnosis not present

## 2020-01-04 DIAGNOSIS — Z86010 Personal history of colonic polyps: Secondary | ICD-10-CM | POA: Diagnosis not present

## 2020-01-04 DIAGNOSIS — E041 Nontoxic single thyroid nodule: Secondary | ICD-10-CM | POA: Diagnosis not present

## 2020-01-04 NOTE — Assessment & Plan Note (Signed)
Mild improvement in 3/18  No falls or fx  Taking vit D with good level  Exercising  Will hold off on next dexa with pandemic

## 2020-01-04 NOTE — Assessment & Plan Note (Signed)
Reviewed health habits including diet and exercise and skin cancer prevention Reviewed appropriate screening tests for age  Also reviewed health mt list, fam hx and immunization status , as well as social and family history   See HPI Labs reviewed  Ref for colonoscopy done  Declines flu and pna vaccines  utd Td and also had the shingrix series  Reviewed last dexa  Therapeutic vit D and good exercise with no falls or fractures  Adv directive is utd  No cognitive concerns  Abn hearing screen in R ear - pt states she has not noticed any problems at all and will approach with her ENT specialist  Good health habits Pt continues to decline cholesterol lowering medication

## 2020-01-04 NOTE — Assessment & Plan Note (Signed)
ENT is following No symptoms  Lab Results  Component Value Date   TSH 1.34 12/27/2019

## 2020-01-04 NOTE — Assessment & Plan Note (Signed)
Disc goals for lipids and reasons to control them Rev last labs with pt Rev low sat fat diet in detail LDL still at 160  Good diet Pt continues to decline tx for this

## 2020-01-04 NOTE — Patient Instructions (Addendum)
For cholesterol  Avoid red meat/ fried foods/ egg yolks/ fatty breakfast meats/ butter, cheese and high fat dairy/ and shellfish     Labs are stable    I placed a colonoscopy referral and out office will call you about that

## 2020-01-04 NOTE — Assessment & Plan Note (Signed)
Pt had last colonoscopy in 2011 and would like to schedule another  No symptoms  She is healthy  Ref done

## 2020-01-04 NOTE — Progress Notes (Signed)
Subjective:    Patient ID: Melissa Blackwell, female    DOB: 23-May-1946, 74 y.o.   MRN: [id number]  This visit occurred during the SARS-CoV-2 public health emergency.  Safety protocols were in place, including screening questions prior to the visit, additional usage of staff PPE, and extensive cleaning of exam room while observing appropriate contact time as indicated for disinfecting solutions.    HPI Pt presents for amw and review of chronic health problems   I have personally reviewed the Medicare Annual Wellness questionnaire and have noted 1. The patient's medical and social history 2. Their use of alcohol, tobacco or illicit drugs 3. Their current medications and supplements 4. The patient's functional ability including ADL's, fall risks, home safety risks and hearing or visual             impairment. 5. Diet and physical activities 6. Evidence for depression or mood disorders  The patients weight, height, BMI have been recorded in the chart and visual acuity is per eye clinic.  I have made referrals, counseling and provided education to the patient based review of the above and I have provided the pt with a written personalized care plan for preventive services. Reviewed and updated provider list, see scanned forms.  See scanned forms.  Routine anticipatory guidance given to patient.  See health maintenance. Colon cancer screening  Had colonoscopy 2011- is interested in one more  Breast cancer screening  Mammogram 8/20  Self breast exam-no lumps  Flu vaccine-declines Tetanus vaccine  11/14 Td Pneumovax declines  Zoster vaccine 12/16 zostavax , had the shingrix vaccine in 2020  Dexa 3/18 osteopenia with mild improvement  Falls none Fractures-none  Supplements- taking vit D  D level is good at 39 Exercise - not as much in the past few mo-just started back   Advance directive-has a living will  Cognitive function addressed- see scanned forms- and if abnormal then additional  documentation follows. 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  PMH and SH reviewed  Meds, vitals, and allergies reviewed.   ROS: See HPI.  Otherwise negative.    Weight : Wt Readings from Last 3 Encounters:  01/04/20 128 lb 7 oz (58.3 kg)  12/25/18 136 lb (61.7 kg)  12/18/18 134 lb 12 oz (61.1 kg)  wt is down  Very busy-stress/not eating as much  22.75 kg/m   Hearing/vision:  Hearing Screening   125Hz  250Hz  500Hz  1000Hz  2000Hz  3000Hz  4000Hz  6000Hz  8000Hz   Right ear:   0 0 0  0    Left ear:   40 40 40  0    Vision Screening Comments: Eye exam in 10/2019 @ [facility]/Dr. Philbert Blackwell the decreased hearing in r ear is new Had ears cleaned out in July   BP Readings from Last 3 Encounters:  01/04/20 122/68  12/25/18 122/68  12/18/18 118/76   Pulse Readings from Last 3 Encounters:  01/04/20 72  12/25/18 67  12/18/18 67     Hyperlipidemia Lab Results  Component Value Date   CHOL 250 (H) 12/27/2019   CHOL 257 (H) 12/18/2018   CHOL 233 (H) 12/03/2017   Lab Results  Component Value Date   HDL 68.60 12/27/2019   HDL 75.80 12/18/2018   HDL 77.40 12/03/2017   Lab Results  Component Value Date   LDLCALC 160 (H) 12/27/2019   LDLCALC 160 (H) 12/18/2018   LDLCALC 139 (H) 12/03/2017   Lab Results  Component Value Date   TRIG 110.0 12/27/2019   TRIG 107.0 12/18/2018   TRIG 79.0 12/03/2017   Lab Results  Component Value Date   CHOLHDL 4 12/27/2019   CHOLHDL 3 12/18/2018   CHOLHDL 3 12/03/2017   Lab Results  Component Value Date   LDLDIRECT 124.9 10/14/2013   LDLDIRECT 148.5 10/07/2012   LDLDIRECT 129.0 10/10/2011  declines cholesterol medicine  Diet is usually good    ENT found a thyroid nodule Is obs Lab Results  Component Value Date   TSH 1.34 12/27/2019    Lab Results  Component Value Date   CREATININE 0.70 12/27/2019   BUN 17 12/27/2019   NA 141 12/27/2019   K 4.5 12/27/2019   CL 104 12/27/2019   CO2 29  12/27/2019   Lab Results  Component Value Date   ALT 17 12/27/2019   AST 20 12/27/2019   ALKPHOS 67 12/27/2019   BILITOT 0.4 12/27/2019   glucose 95  Lab Results  Component Value Date   WBC 4.4 12/27/2019   HGB 13.1 12/27/2019   HCT 40.7 12/27/2019   MCV 89.5 12/27/2019   PLT 187.0 12/27/2019    Patient Active Problem List   Diagnosis Date Noted  . Thyroid nodule 01/04/2020  . Estrogen deficiency 11/20/2016  . Vitamin D deficiency 10/31/2014  . Hyperlipidemia 10/31/2014  . Hyperkalemia 10/27/2013  . Encounter for Medicare annual wellness exam 10/13/2013  . Other screening mammogram 10/15/2011  . DIVERTICULOSIS, COLON 08/23/2010  . COLONIC POLYPS, ADENOMATOUS, HX OF 08/23/2010  . LYME DISEASE 08/10/2007  . Hemorrhoids 07/21/2007  . CONSTIPATION, CHRONIC 07/21/2007  . Osteopenia 07/21/2007  . RHEUMATOID FACTOR, POSITIVE 07/21/2007  . SKIN CANCER, HX OF 07/21/2007   Past Medical History:  Diagnosis Date  . Cancer ([place])    skin CA Hx of squamous cell in situ  . Diverticulosis of sigmoid colon    mild  . GI bleed   . Hx of adenomatous colonic polyps   . Hyperlipidemia   . Internal hemorrhoids   . Lyme disease 06/2007   Dr Knox Royalty  . Osteopenia   . Renal cyst    right renal cyst   Past Surgical History:  Procedure Laterality Date  . ABDOMINAL HYSTERECTOMY  03/1996   partial fibroids  . hemorrhoid banding    . HEMORROIDECTOMY  04/2001  . RHINOPLASTY    . scar removal     keloid  scar  . SPINE SURGERY     spinal fusion   Social History   Tobacco Use  . Smoking status: Never Smoker  . Smokeless tobacco: Never Used  Substance Use Topics  . Alcohol use: Yes    Alcohol/week: 0.0 standard drinks    Comment: rarely - twice a year  . Drug use: No   Family History  Problem Relation Age of Onset  . Cancer Mother        pancreatic CA  . Heart disease Mother   . Heart disease Father        CHF and heart problems  . Hyperlipidemia Sister   . Cancer Sister         uterine CA  . Heart disease Maternal Grandmother    No Known Allergies Current Outpatient Medications on File Prior to Visit  Medication Sig Dispense Refill  . Acetylcysteine (N-ACETYL-L-CYSTEINE) 600 MG CAPS Take 1 tablet by mouth daily.    . calcium carbonate (OS-CAL) 600 MG TABS Take 600 mg by mouth 2 (two) times daily with a  meal.      . Cholecalciferol (VITAMIN D3) 5000 UNITS CAPS Take 5,000 Units by mouth daily.    Marland Kitchen ELDERBERRY PO Take 400 mg by mouth daily.    . Misc Natural Products (TURMERIC CURCUMIN) CAPS Take 1 capsule by mouth daily.    . Multiple Vitamin (MULTIVITAMIN) capsule Take 1 capsule by mouth daily.    . Probiotic Product (PROBIOTIC PO) Take 1 capsule by mouth daily.      . Red Yeast Rice Extract 600 MG CAPS Take 1 capsule by mouth 2 (two) times daily.      . Zinc 50 MG TABS Take 1 tablet by mouth daily.     No current facility-administered medications on file prior to visit.    Review of Systems  Constitutional: Negative for activity change, appetite change, fatigue, fever and unexpected weight change.  HENT: Negative for congestion, ear pain, rhinorrhea, sinus pressure and sore throat.   Eyes: Negative for pain, redness and visual disturbance.  Respiratory: Negative for cough, shortness of breath and wheezing.   Cardiovascular: Negative for chest pain and palpitations.  Gastrointestinal: Negative for abdominal pain, blood in stool, constipation and diarrhea.  Endocrine: Negative for polydipsia and polyuria.  Genitourinary: Negative for dysuria, frequency and urgency.  Musculoskeletal: Negative for arthralgias, back pain and myalgias.  Skin: Negative for pallor and rash.  Allergic/Immunologic: Negative for environmental allergies.  Neurological: Negative for dizziness, syncope and headaches.  Hematological: Negative for adenopathy. Does not bruise/bleed easily.  Psychiatric/Behavioral: Negative for decreased concentration and dysphoric mood. The patient is  not nervous/anxious.        Objective:   Physical Exam Constitutional:      General: She is not in acute distress.    Appearance: Normal appearance. She is well-developed and normal weight. She is not ill-appearing or diaphoretic.     Comments: Slim   HENT:     Head: Normocephalic and atraumatic.     Right Ear: Tympanic membrane, ear canal and external ear normal.     Left Ear: Tympanic membrane, ear canal and external ear normal.     Nose: Nose normal. No congestion.     Mouth/Throat:     Mouth: Mucous membranes are moist.     Pharynx: Oropharynx is clear. No posterior oropharyngeal erythema.  Eyes:     General: No scleral icterus.    Extraocular Movements: Extraocular movements intact.     Conjunctiva/sclera: Conjunctivae normal.     Pupils: Pupils are equal, round, and reactive to light.  Neck:     Thyroid: No thyromegaly.     Vascular: No carotid bruit or JVD.  Cardiovascular:     Rate and Rhythm: Normal rate and regular rhythm.     Pulses: Normal pulses.     Heart sounds: Normal heart sounds. No gallop.   Pulmonary:     Effort: Pulmonary effort is normal. No respiratory distress.     Breath sounds: Normal breath sounds. No wheezing.     Comments: Good air exch Chest:     Chest wall: No tenderness.  Abdominal:     General: Bowel sounds are normal. There is no distension or abdominal bruit.     Palpations: Abdomen is soft. There is no mass.     Tenderness: There is no abdominal tenderness.     Hernia: No hernia is present.  Genitourinary:    Comments: Breast exam: No mass, nodules, thickening, tenderness, bulging, retraction, inflamation, nipple discharge or skin changes noted.  No axillary or clavicular  LA.     Musculoskeletal:        General: No tenderness. Normal range of motion.     Cervical back: Normal range of motion and neck supple. No rigidity. No muscular tenderness.     Right lower leg: No edema.     Left lower leg: No edema.     Comments: No kyphosis     Lymphadenopathy:     Cervical: No cervical adenopathy.  Skin:    General: Skin is warm and dry.     Coloration: Skin is not pale.     Findings: No erythema or rash.     Comments: Solar lentigines diffusely Some sks   Neurological:     Mental Status: She is alert. Mental status is at baseline.     Cranial Nerves: No cranial nerve deficit.     Motor: No abnormal muscle tone.     Coordination: Coordination normal.     Gait: Gait normal.     Deep Tendon Reflexes: Reflexes are normal and symmetric. Reflexes normal.  Psychiatric:        Mood and Affect: Mood normal.        Cognition and Memory: Cognition and memory normal.           Assessment & Plan:   Problem List Items Addressed This Visit      Endocrine   Thyroid nodule    ENT is following No symptoms  Lab Results  Component Value Date   TSH 1.34 12/27/2019           Musculoskeletal and Integument   Osteopenia    Mild improvement in 3/18  No falls or fx  Taking vit D with good level  Exercising  Will hold off on next dexa with pandemic         Other   LYME DISEASE    Continues to do well s/p tx       COLONIC POLYPS, ADENOMATOUS, HX OF    Pt had last colonoscopy in 2011 and would like to schedule another  No symptoms  She is healthy  Ref done       Relevant Orders   Ambulatory referral to Gastroenterology   Encounter for Medicare annual wellness exam - Primary    Reviewed health habits including diet and exercise and skin cancer prevention Reviewed appropriate screening tests for age  Also reviewed health mt list, fam hx and immunization status , as well as social and family history   See HPI Labs reviewed  Ref for colonoscopy done  Declines flu and pna vaccines  utd Td and also had the shingrix series  Reviewed last dexa  Therapeutic vit D and good exercise with no falls or fractures  Adv directive is utd  No cognitive concerns  Abn hearing screen in R ear - pt states she has not noticed  any problems at all and will approach with her ENT specialist  Good health habits Pt continues to decline cholesterol lowering medication        Vitamin D deficiency    Vitamin D level is therapeutic with current supplementation Disc importance of this to bone and overall health Level of 65      Hyperlipidemia    Disc goals for lipids and reasons to control them Rev last labs with pt Rev low sat fat diet in detail LDL still at 160  Good diet Pt continues to decline tx for this

## 2020-01-04 NOTE — Assessment & Plan Note (Signed)
Vitamin D level is therapeutic with current supplementation Disc importance of this to bone and overall health Level of 65 

## 2020-01-04 NOTE — Assessment & Plan Note (Signed)
Continues to do well s/p tx

## 2020-01-20 ENCOUNTER — Encounter: Payer: Self-pay | Admitting: Family Medicine

## 2020-01-20 NOTE — Telephone Encounter (Signed)
Info given to [place], she will f/u with pt

## 2020-03-06 DIAGNOSIS — E041 Nontoxic single thyroid nodule: Secondary | ICD-10-CM | POA: Diagnosis not present

## 2020-03-20 DIAGNOSIS — L821 Other seborrheic keratosis: Secondary | ICD-10-CM | POA: Diagnosis not present

## 2020-03-20 DIAGNOSIS — Z85828 Personal history of other malignant neoplasm of skin: Secondary | ICD-10-CM | POA: Diagnosis not present

## 2020-03-20 DIAGNOSIS — D1801 Hemangioma of skin and subcutaneous tissue: Secondary | ICD-10-CM | POA: Diagnosis not present

## 2020-03-20 DIAGNOSIS — B009 Herpesviral infection, unspecified: Secondary | ICD-10-CM | POA: Diagnosis not present

## 2020-03-20 DIAGNOSIS — L905 Scar conditions and fibrosis of skin: Secondary | ICD-10-CM | POA: Diagnosis not present

## 2020-08-24 ENCOUNTER — Encounter: Payer: Self-pay | Admitting: Gastroenterology

## 2020-09-18 ENCOUNTER — Other Ambulatory Visit: Payer: Self-pay | Admitting: Family Medicine

## 2020-09-18 DIAGNOSIS — L905 Scar conditions and fibrosis of skin: Secondary | ICD-10-CM | POA: Diagnosis not present

## 2020-09-18 DIAGNOSIS — B009 Herpesviral infection, unspecified: Secondary | ICD-10-CM | POA: Diagnosis not present

## 2020-09-18 DIAGNOSIS — L820 Inflamed seborrheic keratosis: Secondary | ICD-10-CM | POA: Diagnosis not present

## 2020-09-18 DIAGNOSIS — L821 Other seborrheic keratosis: Secondary | ICD-10-CM | POA: Diagnosis not present

## 2020-09-18 DIAGNOSIS — Z85828 Personal history of other malignant neoplasm of skin: Secondary | ICD-10-CM | POA: Diagnosis not present

## 2020-09-18 DIAGNOSIS — D1801 Hemangioma of skin and subcutaneous tissue: Secondary | ICD-10-CM | POA: Diagnosis not present

## 2020-09-18 DIAGNOSIS — Z1231 Encounter for screening mammogram for malignant neoplasm of breast: Secondary | ICD-10-CM

## 2020-09-20 ENCOUNTER — Ambulatory Visit
Admission: RE | Admit: 2020-09-20 | Discharge: 2020-09-20 | Disposition: A | Discharge status: Home / Self Care | Payer: Medicare Other | Source: Ambulatory Visit | Attending: Family Medicine | Admitting: Family Medicine

## 2020-09-20 ENCOUNTER — Other Ambulatory Visit: Payer: Self-pay

## 2020-09-20 DIAGNOSIS — Z1231 Encounter for screening mammogram for malignant neoplasm of breast: Secondary | ICD-10-CM | POA: Diagnosis not present

## 2020-10-02 DIAGNOSIS — H6123 Impacted cerumen, bilateral: Secondary | ICD-10-CM | POA: Diagnosis not present

## 2020-10-22 DIAGNOSIS — Z23 Encounter for immunization: Secondary | ICD-10-CM | POA: Diagnosis not present

## 2020-10-27 ENCOUNTER — Encounter: Payer: Medicare Other | Admitting: Gastroenterology

## 2020-11-09 ENCOUNTER — Other Ambulatory Visit: Payer: Self-pay

## 2020-11-09 ENCOUNTER — Ambulatory Visit (AMBULATORY_SURGERY_CENTER): Payer: Self-pay | Admitting: *Deleted

## 2020-11-09 VITALS — Ht 63.0 in | Wt 133.0 lb

## 2020-11-09 DIAGNOSIS — Z1211 Encounter for screening for malignant neoplasm of colon: Secondary | ICD-10-CM

## 2020-11-09 MED ORDER — PLENVU 140 G PO SOLR: 1.0000 | ORAL | 0 refills | Status: DC

## 2020-11-09 NOTE — Progress Notes (Signed)
No egg or soy allergy known to patient  No issues with past sedation with any surgeries or procedures- with wrist fx surgery, post op slept x 3 days -  no intubation problems in the past  No FH of Malignant Hyperthermia No diet pills per patient No home 02 use per patient  No blood thinners per patient  Pt denies issues with constipation  No A fib or A flutter  EMMI video to pt or via [place] 19 guidelines implemented in [facility] today with Pt and RN   Fully vaccinated with booster   [employer] Medicare  Coupon given to pt in [facility] today , Code to Pharmacy   Due to the COVID-19 pandemic we are asking patients to follow these guidelines. Please only bring one care partner. Please be aware that your care partner may wait in the car in the parking lot or if they feel like they will be too hot to wait in the car, they may wait in the lobby on the 4th floor. All care partners are required to wear a mask the entire time (we do not have any that we can provide them), they need to practice social distancing, and we will do a Covid check for all patient's and care partners when you arrive. Also we will check their temperature and your temperature. If the care partner waits in their car they need to stay in the parking lot the entire time and we will call them on their cell phone when the patient is ready for discharge so they can bring the car to the front of the building. Also all patient's will need to wear a mask into building.

## 2020-11-13 ENCOUNTER — Encounter: Payer: Medicare Other | Admitting: Gastroenterology

## 2020-11-20 DIAGNOSIS — H25813 Combined forms of age-related cataract, bilateral: Secondary | ICD-10-CM | POA: Diagnosis not present

## 2020-11-20 DIAGNOSIS — H40023 Open angle with borderline findings, high risk, bilateral: Secondary | ICD-10-CM | POA: Diagnosis not present

## 2020-11-23 ENCOUNTER — Ambulatory Visit (AMBULATORY_SURGERY_CENTER): Payer: Medicare Other | Admitting: Gastroenterology

## 2020-11-23 ENCOUNTER — Other Ambulatory Visit: Payer: Self-pay

## 2020-11-23 ENCOUNTER — Encounter: Payer: Self-pay | Admitting: Gastroenterology

## 2020-11-23 VITALS — BP 128/65 | HR 60 | Temp 97.3°F | Resp 16 | Ht 63.0 in | Wt 133.0 lb

## 2020-11-23 DIAGNOSIS — D122 Benign neoplasm of ascending colon: Secondary | ICD-10-CM | POA: Diagnosis not present

## 2020-11-23 DIAGNOSIS — D123 Benign neoplasm of transverse colon: Secondary | ICD-10-CM | POA: Diagnosis not present

## 2020-11-23 DIAGNOSIS — Z1211 Encounter for screening for malignant neoplasm of colon: Secondary | ICD-10-CM

## 2020-11-23 MED ORDER — SODIUM CHLORIDE 0.9 % IV SOLN: 500.0000 mL | Freq: Once | INTRAVENOUS | Status: DC

## 2020-11-23 NOTE — Progress Notes (Signed)
Pt's states no medical or surgical changes since previsit or office visit.  CW vitals and JD IV. 

## 2020-11-23 NOTE — Op Note (Signed)
[facility] Patient Name: Melissa Blackwell Procedure Date: 11/23/2020 10:53 AM MRN: [id number] Endoscopist: Mauri Pole , MD Age: 74 Referring MD:  Date of Birth: [birth year] Gender: Female Account #: [id number] Procedure:                Colonoscopy Indications:              High risk colon cancer surveillance: Personal                            history of colonic polyps in 2002 and 2007, High                            risk colon cancer surveillance: Personal history of                            adenoma less than 10 mm in size, Last colonoscopy:                            2011 Medicines:                Monitored Anesthesia Care Procedure:                Pre-Anesthesia Assessment:                           - Prior to the procedure, a History and Physical                            was performed, and patient medications and                            allergies were reviewed. The patient's tolerance of                            previous anesthesia was also reviewed. The risks                            and benefits of the procedure and the sedation                            options and risks were discussed with the patient.                            All questions were answered, and informed consent                            was obtained. Prior Anticoagulants: The patient has                            taken no previous anticoagulant or antiplatelet                            agents. ASA Grade Assessment: II - A patient with  mild systemic disease. After reviewing the risks                            and benefits, the patient was deemed in                            satisfactory condition to undergo the procedure.                           After obtaining informed consent, the colonoscope                            was passed under direct vision. Throughout the                            procedure, the patient's blood pressure, pulse, and                             oxygen saturations were monitored continuously. The                            Colonoscope was introduced through the anus and                            advanced to the the cecum, identified by                            appendiceal orifice and ileocecal valve. The                            colonoscopy was performed without difficulty. The                            patient tolerated the procedure well. The quality                            of the bowel preparation was excellent. The                            ileocecal valve, appendiceal orifice, and rectum                            were photographed. Scope In: 11:09:44 AM Scope Out: 11:24:38 AM Scope Withdrawal Time: 0 hours 9 minutes 42 seconds  Total Procedure Duration: 0 hours 14 minutes 54 seconds  Findings:                 The perianal and digital rectal examinations were                            normal.                           A 5 mm polyp was found in the ascending colon. The  polyp was sessile. The polyp was removed with a                            cold snare. Resection and retrieval were complete.                           Three sessile polyps were found in the transverse                            colon. The polyps were 1 to 2 mm in size. These                            polyps were removed with a cold biopsy forceps.                            Resection and retrieval were complete.                           Scattered small and large-mouthed diverticula were                            found in the sigmoid colon, descending colon,                            transverse colon and ascending colon.                           A diffuse area of mild melanosis was found in the                            entire colon.                           Non-bleeding internal hemorrhoids were found during                            retroflexion. The hemorrhoids were  medium-sized. Complications:            No immediate complications. Estimated Blood Loss:     Estimated blood loss was minimal. Impression:               - One 5 mm polyp in the ascending colon, removed                            with a cold snare. Resected and retrieved.                           - Three 1 to 2 mm polyps in the transverse colon,                            removed with a cold biopsy forceps. Resected and                            retrieved.                           -  Moderate diverticulosis in the sigmoid colon, in                            the descending colon, in the transverse colon and                            in the ascending colon.                           - Melanosis in the colon.                           - Non-bleeding internal hemorrhoids. Recommendation:           - Patient has a contact number available for                            emergencies. The signs and symptoms of potential                            delayed complications were discussed with the                            patient. Return to normal activities tomorrow.                            Written discharge instructions were provided to the                            patient.                           - Resume previous diet.                           - Continue present medications.                           - Await pathology results.                           - Repeat colonoscopy in 3 - 5 years for                            surveillance based on pathology results.                           - Return to my office at the next available                            appointment for hemorrhoidal band ligation. Mauri Pole, MD 11/23/2020 11:32:43 AM This report has been signed electronically.

## 2020-11-23 NOTE — Progress Notes (Signed)
To Pacu VSS Report to [email or web address]

## 2020-11-23 NOTE — Progress Notes (Signed)
Called to room to assist during endoscopic procedure.  Patient ID and intended procedure confirmed with present staff. Received instructions for my participation in the procedure from the performing physician.  

## 2020-11-23 NOTE — Patient Instructions (Addendum)
YOU HAD AN ENDOSCOPIC PROCEDURE TODAY AT [facility]:   Refer to the procedure report that was given to you for any specific questions about what was found during the examination.  If the procedure report does not answer your questions, please call your gastroenterologist to clarify.  If you requested that your care partner not be given the details of your procedure findings, then the procedure report has been included in a sealed envelope for you to review at your convenience later.  YOU SHOULD EXPECT: Some feelings of bloating in the abdomen. Passage of more gas than usual.  Walking can help get rid of the air that was put into your GI tract during the procedure and reduce the bloating. If you had a lower endoscopy (such as a colonoscopy or flexible sigmoidoscopy) you may notice spotting of blood in your stool or on the toilet paper. If you underwent a bowel prep for your procedure, you may not have a normal bowel movement for a few days.  Please Note:  You might notice some irritation and congestion in your nose or some drainage.  This is from the oxygen used during your procedure.  There is no need for concern and it should clear up in a day or so.  SYMPTOMS TO REPORT IMMEDIATELY:   Following lower endoscopy (colonoscopy or flexible sigmoidoscopy):  Excessive amounts of blood in the stool  Significant tenderness or worsening of abdominal pains  Swelling of the abdomen that is new, acute  Fever of 100F or higher   For urgent or emergent issues, a gastroenterologist can be reached at any hour by calling [phone]. Do not use MyChart messaging for urgent concerns.    DIET:  We do recommend a small meal at first, but then you may proceed to your regular diet.  Drink plenty of fluids but you should avoid alcoholic beverages for 24 hours.  MEDICATIONS: Continue present medications.  FOLLOW UP: Return to Dr. Woodward Ku office for an appointment on Dec 27, 2019 at 3:50pm   for hemorrhoidal band ligation.  Please see handouts given to you by your recovery nurse.  ACTIVITY:  You should plan to take it easy for the rest of today and you should NOT DRIVE or use heavy machinery until tomorrow (because of the sedation medicines used during the test).    FOLLOW UP: Our staff will call the number listed on your records 48-72 hours following your procedure to check on you and address any questions or concerns that you may have regarding the information given to you following your procedure. If we do not reach you, we will leave a message.  We will attempt to reach you two times.  During this call, we will ask if you have developed any symptoms of COVID 19. If you develop any symptoms (ie: fever, flu-like symptoms, shortness of breath, cough etc.) before then, please call [phone].  If you test positive for Covid 19 in the 2 weeks post procedure, please call and report this information to [place].    If any biopsies were taken you will be contacted by phone or by letter within the next 1-3 weeks.  Please call us at [phone] if you have not heard about the biopsies in 3 weeks.   Thank you for allowing [place] to provide for your healthcare needs today.   SIGNATURES/CONFIDENTIALITY: You and/or your care partner have signed paperwork which will be entered into your electronic medical record.  These signatures attest to the  fact that that the information above on your After Visit Summary has been reviewed and is understood.  Full responsibility of the confidentiality of this discharge information lies with you and/or your care-partner.

## 2020-11-27 ENCOUNTER — Telehealth: Payer: Self-pay

## 2020-11-27 ENCOUNTER — Telehealth: Payer: Self-pay | Admitting: *Deleted

## 2020-11-27 NOTE — Telephone Encounter (Signed)
Left message on follow up call. 

## 2020-11-27 NOTE — Telephone Encounter (Signed)
  Follow up Call-  Call back number 11/23/2020  Post procedure Call Back phone  # [phone]  Permission to leave phone message Yes  Some recent data might be hidden     No answer at 2nd attempt follow up phone call.  Left message on voicemail.

## 2020-11-28 ENCOUNTER — Telehealth: Payer: Self-pay | Admitting: Gastroenterology

## 2020-11-29 NOTE — Telephone Encounter (Signed)
Spoke with the patient. Looked at the pathology report that she has received notification of. Advised no cancer and to expect the doctor to send her a letter with recommendations based on her results.

## 2020-12-06 ENCOUNTER — Encounter: Payer: Self-pay | Admitting: Gastroenterology

## 2020-12-26 ENCOUNTER — Ambulatory Visit (INDEPENDENT_AMBULATORY_CARE_PROVIDER_SITE_OTHER): Payer: Medicare Other | Admitting: Gastroenterology

## 2020-12-26 ENCOUNTER — Encounter: Payer: Self-pay | Admitting: Gastroenterology

## 2020-12-26 VITALS — BP 122/80 | HR 68 | Ht 63.0 in | Wt 133.4 lb

## 2020-12-26 DIAGNOSIS — K641 Second degree hemorrhoids: Secondary | ICD-10-CM

## 2020-12-26 MED ORDER — HYDROCORTISONE ACETATE 25 MG RE SUPP: 25.0000 mg | Freq: Every day | RECTAL | 1 refills | Status: DC

## 2020-12-26 NOTE — Patient Instructions (Addendum)
HEMORRHOID BANDING PROCEDURE    FOLLOW-UP CARE   1. The procedure you have had should have been relatively painless since the banding of the area involved does not have nerve endings and there is no pain sensation.  The rubber band cuts off the blood supply to the hemorrhoid and the band may fall off as soon as 48 hours after the banding (the band may occasionally be seen in the toilet bowl following a bowel movement). You may notice a temporary feeling of fullness in the rectum which should respond adequately to plain Tylenol or Motrin.  2. Following the banding, avoid strenuous exercise that evening and resume full activity the next day.  A sitz bath (soaking in a warm tub) or bidet is soothing, and can be useful for cleansing the area after bowel movements.     3. To avoid constipation, take two tablespoons of natural wheat bran, natural oat bran, flax, Benefiber or any over the counter fiber supplement and increase your water intake to 7-8 glasses daily.    4. Unless you have been prescribed anorectal medication, do not put anything inside your rectum for two weeks: No suppositories, enemas, fingers, etc.  5. Occasionally, you may have more bleeding than usual after the banding procedure.  This is often from the untreated hemorrhoids rather than the treated one.  Don't be concerned if there is a tablespoon or so of blood.  If there is more blood than this, lie flat with your bottom higher than your head and apply an ice pack to the area. If the bleeding does not stop within a half an hour or if you feel faint, call our office at [phone] or go to the emergency room.  6. Problems are not common; however, if there is a substantial amount of bleeding, severe pain, chills, fever or difficulty passing urine (very rare) or other problems, you should call us at [phone] or report to the nearest emergency room.  7. Do not stay seated continuously for more than 2-3 hours for a day or two  after the procedure.  Tighten your buttock muscles 10-15 times every two hours and take 10-15 deep breaths every 1-2 hours.  Do not spend more than a few minutes on the toilet if you cannot empty your bowel; instead re-visit the toilet at a later time.   Take benefiber twice daily  Take the anusol suppositories starting Sunday night at bedtime, we will send in a prescription to your pharmacy  I appreciate the  opportunity to care for you  Thank You   Marsa Aris , MD

## 2020-12-26 NOTE — Progress Notes (Signed)
PROCEDURE NOTE: The patient presents with symptomatic grade 2  hemorrhoids, requesting rubber band ligation of his/her hemorrhoidal disease.  All risks, benefits and alternative forms of therapy were described and informed consent was obtained.  In the Left Lateral Decubitus position anoscopic examination revealed grade II hemorrhoids in the left lateral, right anterior and right posterior position(s).  The anorectum was pre-medicated with 0.125% NTG and Recticare.  The decision was made to band the left lateral internal hemorrhoid, and the [facility] System was used to perform band ligation without complication.  Digital anorectal examination was then performed to assure proper positioning of the band, and to adjust the banded tissue as required.  The patient was discharged home without pain or other issues.  Dietary and behavioral recommendations were given and along with follow-up instructions.     The following adjunctive treatments were recommended:  Benefiber 1 tablespoon 2-3 times with meals Anusol suppository at bedtime for 5-7 days   The patient will return in 2-4 weeks for  follow-up and possible additional banding as required. No complications were encountered and the patient tolerated the procedure well.  Iona Beard , MD [phone]

## 2020-12-31 ENCOUNTER — Telehealth: Payer: Self-pay | Admitting: Family Medicine

## 2020-12-31 DIAGNOSIS — E875 Hyperkalemia: Secondary | ICD-10-CM

## 2020-12-31 DIAGNOSIS — E7800 Pure hypercholesterolemia, unspecified: Secondary | ICD-10-CM

## 2020-12-31 DIAGNOSIS — E559 Vitamin D deficiency, unspecified: Secondary | ICD-10-CM

## 2020-12-31 DIAGNOSIS — K5909 Other constipation: Secondary | ICD-10-CM

## 2020-12-31 NOTE — Telephone Encounter (Signed)
-----   Message from Cloyd Stagers, RT sent at 12/18/2020  2:07 PM EST ----- Regarding: Lab Orders for Monday 1.10.2022 Please place lab orders for Monday 1.10.2022, office visit for physical on Monday 1.17.2022 Thank you, Dyke Maes RT(R)

## 2021-01-01 ENCOUNTER — Other Ambulatory Visit: Payer: Self-pay

## 2021-01-01 ENCOUNTER — Other Ambulatory Visit (INDEPENDENT_AMBULATORY_CARE_PROVIDER_SITE_OTHER): Payer: Medicare Other

## 2021-01-01 DIAGNOSIS — K5909 Other constipation: Secondary | ICD-10-CM

## 2021-01-01 DIAGNOSIS — E559 Vitamin D deficiency, unspecified: Secondary | ICD-10-CM | POA: Diagnosis not present

## 2021-01-01 DIAGNOSIS — E875 Hyperkalemia: Secondary | ICD-10-CM

## 2021-01-01 DIAGNOSIS — E7800 Pure hypercholesterolemia, unspecified: Secondary | ICD-10-CM

## 2021-01-01 LAB — CBC WITH DIFFERENTIAL/PLATELET
Basophils Absolute: 0.1 10*3/uL (ref 0.0–0.1)
Basophils Relative: 1.2 % (ref 0.0–3.0)
Eosinophils Absolute: 0.1 10*3/uL (ref 0.0–0.7)
Eosinophils Relative: 1.3 % (ref 0.0–5.0)
HCT: 40.8 % (ref 36.0–46.0)
Hemoglobin: 13.4 g/dL (ref 12.0–15.0)
Lymphocytes Relative: 25 % (ref 12.0–46.0)
Lymphs Abs: 1.2 10*3/uL (ref 0.7–4.0)
MCHC: 32.9 g/dL (ref 30.0–36.0)
MCV: 86.9 fl (ref 78.0–100.0)
Monocytes Absolute: 0.4 10*3/uL (ref 0.1–1.0)
Monocytes Relative: 8.5 % (ref 3.0–12.0)
Neutro Abs: 3 10*3/uL (ref 1.4–7.7)
Neutrophils Relative %: 64 % (ref 43.0–77.0)
Platelets: 209 10*3/uL (ref 150.0–400.0)
RBC: 4.7 Mil/uL (ref 3.87–5.11)
RDW: 14.5 % (ref 11.5–15.5)
WBC: 4.8 10*3/uL (ref 4.0–10.5)

## 2021-01-01 LAB — LIPID PANEL
Cholesterol: 249 mg/dL — ABNORMAL HIGH (ref 0–200)
HDL: 74.1 mg/dL (ref >39.00)
LDL Cholesterol: 155 mg/dL — ABNORMAL HIGH (ref 0–99)
NonHDL: 174.46
Total CHOL/HDL Ratio: 3
Triglycerides: 97 mg/dL (ref 0.0–149.0)
VLDL: 19.4 mg/dL (ref 0.0–40.0)

## 2021-01-01 LAB — COMPREHENSIVE METABOLIC PANEL
ALT: 17 U/L (ref 0–35)
AST: 18 U/L (ref 0–37)
Albumin: 4.7 g/dL (ref 3.5–5.2)
Alkaline Phosphatase: 73 U/L (ref 39–117)
BUN: 16 mg/dL (ref 6–23)
CO2: 31 mEq/L (ref 19–32)
Calcium: 9.8 mg/dL (ref 8.4–10.5)
Chloride: 104 mEq/L (ref 96–112)
Creatinine, Ser: 0.75 mg/dL (ref 0.40–1.20)
GFR: 77.65 mL/min (ref >60.00)
Glucose, Bld: 95 mg/dL (ref 70–99)
Potassium: 4.9 mEq/L (ref 3.5–5.1)
Sodium: 140 mEq/L (ref 135–145)
Total Bilirubin: 0.5 mg/dL (ref 0.2–1.2)
Total Protein: 7 g/dL (ref 6.0–8.3)

## 2021-01-01 LAB — TSH: TSH: 1.83 u[IU]/mL (ref 0.35–4.50)

## 2021-01-01 LAB — VITAMIN D 25 HYDROXY (VIT D DEFICIENCY, FRACTURES): VITD: 69.21 ng/mL (ref 30.00–100.00)

## 2021-01-08 ENCOUNTER — Ambulatory Visit: Payer: Medicare Other | Admitting: Family Medicine

## 2021-01-15 ENCOUNTER — Other Ambulatory Visit: Payer: Self-pay

## 2021-01-15 ENCOUNTER — Encounter: Payer: Self-pay | Admitting: Family Medicine

## 2021-01-15 ENCOUNTER — Ambulatory Visit (INDEPENDENT_AMBULATORY_CARE_PROVIDER_SITE_OTHER): Payer: Medicare Other | Admitting: Family Medicine

## 2021-01-15 VITALS — BP 122/66 | HR 79 | Temp 97.2°F | Ht 63.25 in | Wt 133.6 lb

## 2021-01-15 DIAGNOSIS — Z0000 Encounter for general adult medical examination without abnormal findings: Secondary | ICD-10-CM

## 2021-01-15 DIAGNOSIS — E2839 Other primary ovarian failure: Secondary | ICD-10-CM | POA: Diagnosis not present

## 2021-01-15 DIAGNOSIS — E7800 Pure hypercholesterolemia, unspecified: Secondary | ICD-10-CM | POA: Diagnosis not present

## 2021-01-15 DIAGNOSIS — Z8619 Personal history of other infectious and parasitic diseases: Secondary | ICD-10-CM

## 2021-01-15 DIAGNOSIS — E559 Vitamin D deficiency, unspecified: Secondary | ICD-10-CM | POA: Diagnosis not present

## 2021-01-15 DIAGNOSIS — Z86010 Personal history of colonic polyps: Secondary | ICD-10-CM

## 2021-01-15 DIAGNOSIS — M8589 Other specified disorders of bone density and structure, multiple sites: Secondary | ICD-10-CM | POA: Diagnosis not present

## 2021-01-15 NOTE — Assessment & Plan Note (Signed)
Last tx several years ago  Continues to do well

## 2021-01-15 NOTE — Patient Instructions (Signed)
I'm glad you are doing so well  Keep up the good work with diet and exercise  Stay busy   Labs look ok   If you change your mind about cholesterol treatment let us know   Schedule your bone density test when it is convenient to you

## 2021-01-15 NOTE — Assessment & Plan Note (Signed)
Due for dexa Order in and pt will call to schedule  Nl falls or fx   Good exercise  Vit D level is good

## 2021-01-15 NOTE — Assessment & Plan Note (Signed)
Had a colonoscopy last month

## 2021-01-15 NOTE — Assessment & Plan Note (Signed)
Disc goals for lipids and reasons to control them Rev last labs with pt Rev low sat fat diet in detail LDL of 155  Declines tx  HDL is good

## 2021-01-15 NOTE — Assessment & Plan Note (Signed)
Reviewed health habits including diet and exercise and skin cancer prevention Reviewed appropriate screening tests for age  Also reviewed health mt list, fam hx and immunization status , as well as social and family history   See HPI Labs reviewed  Cancer screening is utd Declines flu and pna vaccines  dexa 3/18 (ordered) with no falls or fx Good exercise and D level  Adv directive is utd  Hearing -poor in R ear, pt not bothered and declines further eval LDL cholesterol is high/pt declines tx (HDL is good)

## 2021-01-15 NOTE — Progress Notes (Signed)
Subjective:    Patient ID: Melissa Blackwell, female    DOB: March 19, 1946, 75 y.o.   MRN: [id number]  This visit occurred during the SARS-CoV-2 public health emergency.  Safety protocols were in place, including screening questions prior to the visit, additional usage of staff PPE, and extensive cleaning of exam room while observing appropriate contact time as indicated for disinfecting solutions.    HPI Pt presents for amw and annual f/u of chronic medical problems   I have personally reviewed the Medicare Annual Wellness questionnaire and have noted 1. The patient's medical and social history 2. Their use of alcohol, tobacco or illicit drugs 3. Their current medications and supplements 4. The patient's functional ability including ADL's, fall risks, home safety risks and hearing or visual             impairment. 5. Diet and physical activities 6. Evidence for depression or mood disorders  The patients weight, height, BMI have been recorded in the chart and visual acuity is per eye clinic.  I have made referrals, counseling and provided education to the patient based review of the above and I have provided the pt with a written personalized care plan for preventive services. Reviewed and updated provider list, see scanned forms.  See scanned forms.  Routine anticipatory guidance given to patient.  See health maintenance. Colon cancer screening  Colonoscopy 12/21 Breast cancer screening  Mammogram 9/21 Self breast exam-no lumps  Flu vaccine-declines  Tetanus vaccine 11/14 Td covid vaccine- moderna with booster  shingrix vaccine-done Pneumovax-declines   Dexa  3/18 - osteopenia -ready to do another one  Falls- none Fractures-none Supplements- vit D   D level is 69.2  good Exercise -goes to the gym   Advance directive-up to date  Cognitive function addressed- see scanned forms- and if abnormal then additional documentation follows.   No problems at all with memory or cognition   occ forgets names   PMH and [place] reviewed  Meds, vitals, and allergies reviewed.   ROS: See HPI.  Otherwise negative.    Weight : Wt Readings from Last 3 Encounters:  01/15/21 133 lb 9 oz (60.6 kg)  12/26/20 133 lb 6.4 oz (60.5 kg)  11/23/20 133 lb (60.3 kg)   23.47 kg/m   Hearing Screening   125Hz  250Hz  500Hz  1000Hz  2000Hz  3000Hz  4000Hz  6000Hz  8000Hz   Right ear:   0 0 0  0    Left ear:   40 40 40  0    Vision Screening Comments: Pt had eye exam in 10/2020 at [facility]  No change from last year Not bothered by it  She can hear at church and uses closed cap for TV  Not bothering her at all [facility] team Rozelle Caudle- pcp Nandigam -[facility]-orthop  Overall feeling fair  Had colonoscopy and then hemorrhoid banding  Tried benefiber -gave her watery stools and hard to control  Feels like fiber has knotted up her abdomen  She started back on her herbal laxative  No blood in stool   BP Readings from Last 3 Encounters:  01/15/21 122/66  12/26/20 122/80  11/23/20 128/65   Pulse Readings from Last 3 Encounters:  01/15/21 79  12/26/20 68  11/23/20 60   Hyperlipidemia Lab Results  Component Value Date   CHOL 249 (H) 01/01/2021   CHOL 250 (H) 12/27/2019   CHOL 257 (H) 12/18/2018   Lab Results  Component Value Date   HDL 74.10 01/01/2021  HDL 68.60 12/27/2019   HDL 75.80 12/18/2018   Lab Results  Component Value Date   LDLCALC 155 (H) 01/01/2021   LDLCALC 160 (H) 12/27/2019   LDLCALC 160 (H) 12/18/2018   Lab Results  Component Value Date   TRIG 97.0 01/01/2021   TRIG 110.0 12/27/2019   TRIG 107.0 12/18/2018   Lab Results  Component Value Date   CHOLHDL 3 01/01/2021   CHOLHDL 4 12/27/2019   CHOLHDL 3 12/18/2018   Lab Results  Component Value Date   LDLDIRECT 124.9 10/14/2013   LDLDIRECT 148.5 10/07/2012   LDLDIRECT 129.0 10/10/2011   Pt declines treatment for this  Diet is good  Other labs  Results for orders placed or  performed in visit on 01/01/21  VITAMIN D 25 Hydroxy (Vit-D Deficiency, Fractures)  Result Value Ref Range   VITD 69.21 30.00 - 100.00 ng/mL  TSH  Result Value Ref Range   TSH 1.83 0.35 - 4.50 uIU/mL  Lipid panel  Result Value Ref Range   Cholesterol 249 (H) 0 - 200 mg/dL   Triglycerides 97.0 0.0 - 149.0 mg/dL   HDL 74.10 >39.00 mg/dL   VLDL 19.4 0.0 - 40.0 mg/dL   LDL Cholesterol 155 (H) 0 - 99 mg/dL   Total CHOL/HDL Ratio 3    NonHDL 174.46   Comprehensive metabolic panel  Result Value Ref Range   Sodium 140 135 - 145 mEq/L   Potassium 4.9 3.5 - 5.1 mEq/L   Chloride 104 96 - 112 mEq/L   CO2 31 19 - 32 mEq/L   Glucose, Bld 95 70 - 99 mg/dL   BUN 16 6 - 23 mg/dL   Creatinine, Ser 0.75 0.40 - 1.20 mg/dL   Total Bilirubin 0.5 0.2 - 1.2 mg/dL   Alkaline Phosphatase 73 39 - 117 U/L   AST 18 0 - 37 U/L   ALT 17 0 - 35 U/L   Total Protein 7.0 6.0 - 8.3 g/dL   Albumin 4.7 3.5 - 5.2 g/dL   GFR 77.65 >60.00 mL/min   Calcium 9.8 8.4 - 10.5 mg/dL  CBC with Differential/Platelet  Result Value Ref Range   WBC 4.8 4.0 - 10.5 K/uL   RBC 4.70 3.87 - 5.11 Mil/uL   Hemoglobin 13.4 12.0 - 15.0 g/dL   HCT 40.8 36.0 - 46.0 %   MCV 86.9 78.0 - 100.0 fl   MCHC 32.9 30.0 - 36.0 g/dL   RDW 14.5 11.5 - 15.5 %   Platelets 209.0 150.0 - 400.0 K/uL   Neutrophils Relative % 64.0 43.0 - 77.0 %   Lymphocytes Relative 25.0 12.0 - 46.0 %   Monocytes Relative 8.5 3.0 - 12.0 %   Eosinophils Relative 1.3 0.0 - 5.0 %   Basophils Relative 1.2 0.0 - 3.0 %   Neutro Abs 3.0 1.4 - 7.7 K/uL   Lymphs Abs 1.2 0.7 - 4.0 K/uL   Monocytes Absolute 0.4 0.1 - 1.0 K/uL   Eosinophils Absolute 0.1 0.0 - 0.7 K/uL   Basophils Absolute 0.1 0.0 - 0.1 K/uL   Off the lyme tx protocol for several years   Patient Active Problem List   Diagnosis Date Noted  . Thyroid nodule 01/04/2020  . Estrogen deficiency 11/20/2016  . Vitamin D deficiency 10/31/2014  . Hyperlipidemia 10/31/2014  . Hyperkalemia 10/27/2013  .  Encounter for Medicare annual wellness exam 10/13/2013  . Other screening mammogram 10/15/2011  . DIVERTICULOSIS, COLON 08/23/2010  . COLONIC POLYPS, ADENOMATOUS, HX OF 08/23/2010  . History of  Lyme disease 08/10/2007  . Hemorrhoids 07/21/2007  . CONSTIPATION, CHRONIC 07/21/2007  . Osteopenia 07/21/2007  . RHEUMATOID FACTOR, POSITIVE 07/21/2007  . SKIN CANCER, HX OF 07/21/2007   Past Medical History:  Diagnosis Date  . Allergy   . Cancer ([place])    skin CA Hx of squamous cell in situ  . Diverticulosis of sigmoid colon    mild  . GI bleed   . Hx of adenomatous colonic polyps   . Hyperlipidemia   . Internal hemorrhoids   . Lyme disease 06/2007   Dr Knox Royalty  . Osteopenia   . Renal cyst    right renal cyst  . Thyroid nodule    Past Surgical History:  Procedure Laterality Date  . ABDOMINAL HYSTERECTOMY  03/1996   partial fibroids  . COLONOSCOPY    . hemorrhoid banding    . HEMORROIDECTOMY  04/2001  . RHINOPLASTY    . scar removal     keloid  scar  . WRIST FRACTURE SURGERY     hardware  placed  and removed    Social History   Tobacco Use  . Smoking status: Never Smoker  . Smokeless tobacco: Never Used  Vaping Use  . Vaping Use: Never used  Substance Use Topics  . Alcohol use: Yes    Alcohol/week: 0.0 standard drinks    Comment: rarely - twice a year  . Drug use: No   Family History  Problem Relation Age of Onset  . Cancer Mother        pancreatic CA  . Heart disease Mother   . Pancreatic cancer Mother   . Heart disease Father        CHF and heart problems  . Hyperlipidemia Sister   . Cancer Sister        uterine CA  . Uterine cancer Sister   . Heart disease Maternal Grandmother   . Colon polyps Daughter   . Colon cancer Neg Hx   . Esophageal cancer Neg Hx   . Stomach cancer Neg Hx   . Rectal cancer Neg Hx    No Known Allergies Current Outpatient Medications on File Prior to Visit  Medication Sig Dispense Refill  . aspirin 81 MG chewable tablet Chew  by mouth daily.    . Biotin 10000 MCG TABS Take by mouth.    . calcium carbonate (OS-CAL) 600 MG TABS Take 600 mg by mouth 2 (two) times daily with a meal.    . Chlorophyll 100 MG TABS Take by mouth.    . Cholecalciferol (VITAMIN D3) 5000 UNITS CAPS Take 5,000 Units by mouth daily.    . diphenhydrAMINE (BENADRYL) 25 MG tablet Take 25 mg by mouth at bedtime.    Marland Kitchen KRILL OIL PO Take by mouth.    . Magnesium 200 MG TABS Take by mouth.    . MELATONIN-MAGNESIUM CITRATE PO Take by mouth.    . Multiple Vitamin (MULTIVITAMIN) capsule Take 1 capsule by mouth daily.    . Probiotic Product (PROBIOTIC PO) Take 1 capsule by mouth daily.    . Red Yeast Rice Extract 600 MG CAPS Take 1 capsule by mouth 2 (two) times daily.    Marland Kitchen RESVERATROL PO Take by mouth.    . Turmeric (CURCUMIN 95 PO) Take by mouth.     No current facility-administered medications on file prior to visit.    Review of Systems  Constitutional: Negative for activity change, appetite change, fatigue, fever and unexpected weight change.  HENT: Negative for  congestion, ear pain, rhinorrhea, sinus pressure and sore throat.   Eyes: Negative for pain, redness and visual disturbance.  Respiratory: Negative for cough, shortness of breath and wheezing.   Cardiovascular: Negative for chest pain and palpitations.  Gastrointestinal: Negative for abdominal pain, blood in stool, constipation and diarrhea.  Endocrine: Negative for polydipsia and polyuria.  Genitourinary: Negative for dysuria, frequency and urgency.  Musculoskeletal: Negative for arthralgias, back pain and myalgias.  Skin: Negative for pallor and rash.  Allergic/Immunologic: Negative for environmental allergies.  Neurological: Negative for dizziness, syncope and headaches.  Hematological: Negative for adenopathy. Does not bruise/bleed easily.  Psychiatric/Behavioral: Negative for decreased concentration and dysphoric mood. The patient is not nervous/anxious.        Objective:    Physical Exam Constitutional:      General: She is not in acute distress.    Appearance: Normal appearance. She is well-developed and normal weight. She is not ill-appearing or diaphoretic.  HENT:     Head: Normocephalic and atraumatic.     Right Ear: Tympanic membrane, ear canal and external ear normal.     Left Ear: Tympanic membrane, ear canal and external ear normal.     Nose: Nose normal. No congestion.     Mouth/Throat:     Mouth: Mucous membranes are moist.     Pharynx: Oropharynx is clear. No posterior oropharyngeal erythema.  Eyes:     General: No scleral icterus.    Extraocular Movements: Extraocular movements intact.     Conjunctiva/sclera: Conjunctivae normal.     Pupils: Pupils are equal, round, and reactive to light.  Neck:     Thyroid: No thyromegaly.     Vascular: No carotid bruit or JVD.  Cardiovascular:     Rate and Rhythm: Normal rate and regular rhythm.     Pulses: Normal pulses.     Heart sounds: Normal heart sounds. No gallop.   Pulmonary:     Effort: Pulmonary effort is normal. No respiratory distress.     Breath sounds: Normal breath sounds. No wheezing.     Comments: Good air exch Chest:     Chest wall: No tenderness.  Abdominal:     General: Bowel sounds are normal. There is no distension or abdominal bruit.     Palpations: Abdomen is soft. There is no mass.     Tenderness: There is no abdominal tenderness.     Hernia: No hernia is present.  Genitourinary:    Comments: Breast exam: No mass, nodules, thickening, tenderness, bulging, retraction, inflamation, nipple discharge or skin changes noted.  No axillary or clavicular LA.     Musculoskeletal:        General: No tenderness. Normal range of motion.     Cervical back: Normal range of motion and neck supple. No rigidity. No muscular tenderness.     Right lower leg: No edema.     Left lower leg: No edema.     Comments: No kyphosis  Lymphadenopathy:     Cervical: No cervical adenopathy.  Skin:     General: Skin is warm and dry.     Coloration: Skin is not pale.     Findings: No erythema or rash.     Comments: Solar lentigines diffusely Some sks   Neurological:     Mental Status: She is alert. Mental status is at baseline.     Cranial Nerves: No cranial nerve deficit.     Motor: No abnormal muscle tone.     Coordination: Coordination normal.  Gait: Gait normal.     Deep Tendon Reflexes: Reflexes are normal and symmetric. Reflexes normal.  Psychiatric:        Mood and Affect: Mood normal.        Cognition and Memory: Cognition and memory normal.           Assessment & Plan:   Problem List Items Addressed This Visit      Musculoskeletal and Integument   Osteopenia    Due for dexa Order in and pt will call to schedule  Nl falls or fx   Good exercise  Vit D level is good         Other   History of Lyme disease    Last tx several years ago  Continues to do well      COLONIC POLYPS, ADENOMATOUS, HX OF    Had a colonoscopy last month      Encounter for Medicare annual wellness exam - Primary    Reviewed health habits including diet and exercise and skin cancer prevention Reviewed appropriate screening tests for age  Also reviewed health mt list, fam hx and immunization status , as well as social and family history   See HPI Labs reviewed  Cancer screening is utd Declines flu and pna vaccines  dexa 3/18 (ordered) with no falls or fx Good exercise and D level  Adv directive is utd  Hearing -poor in R ear, pt not bothered and declines further eval LDL cholesterol is high/pt declines tx (HDL is good)       Vitamin D deficiency    Vitamin D level is therapeutic with current supplementation Disc importance of this to bone and overall health  Level of 69.2 on current supplementation)       Hyperlipidemia    Disc goals for lipids and reasons to control them Rev last labs with pt Rev low sat fat diet in detail LDL of 155  Declines tx  HDL is good        Estrogen deficiency   Relevant Orders   DG Bone Density

## 2021-01-15 NOTE — Assessment & Plan Note (Signed)
Vitamin D level is therapeutic with current supplementation Disc importance of this to bone and overall health  Level of 69.2 on current supplementation)

## 2021-02-02 ENCOUNTER — Ambulatory Visit (INDEPENDENT_AMBULATORY_CARE_PROVIDER_SITE_OTHER): Payer: Medicare Other | Admitting: Gastroenterology

## 2021-02-02 ENCOUNTER — Encounter: Payer: Self-pay | Admitting: Gastroenterology

## 2021-02-02 VITALS — BP 110/70 | HR 75 | Ht 63.25 in | Wt 135.0 lb

## 2021-02-02 DIAGNOSIS — K641 Second degree hemorrhoids: Secondary | ICD-10-CM

## 2021-02-02 NOTE — Patient Instructions (Signed)
Normal BMI (Body Mass Index- based on height and weight) is between 23 and 30. Your BMI today is Body mass index is 23.73 kg/m. Marland Kitchen Please consider follow up  regarding your BMI with your Primary Care Provider.  HEMORRHOID BANDING PROCEDURE    FOLLOW-UP CARE   1. The procedure you have had should have been relatively painless since the banding of the area involved does not have nerve endings and there is no pain sensation.  The rubber band cuts off the blood supply to the hemorrhoid and the band may fall off as soon as 48 hours after the banding (the band may occasionally be seen in the toilet bowl following a bowel movement). You may notice a temporary feeling of fullness in the rectum which should respond adequately to plain Tylenol or Motrin.  2. Following the banding, avoid strenuous exercise that evening and resume full activity the next day.  A sitz bath (soaking in a warm tub) or bidet is soothing, and can be useful for cleansing the area after bowel movements.     3. To avoid constipation, take two tablespoons of natural wheat bran, natural oat bran, flax, Benefiber or any over the counter fiber supplement and increase your water intake to 7-8 glasses daily.    4. Unless you have been prescribed anorectal medication, do not put anything inside your rectum for two weeks: No suppositories, enemas, fingers, etc.  5. Occasionally, you may have more bleeding than usual after the banding procedure.  This is often from the untreated hemorrhoids rather than the treated one.  Don't be concerned if there is a tablespoon or so of blood.  If there is more blood than this, lie flat with your bottom higher than your head and apply an ice pack to the area. If the bleeding does not stop within a half an hour or if you feel faint, call our office at [phone] or go to the emergency room.  6. Problems are not common; however, if there is a substantial amount of bleeding, severe pain, chills, fever or  difficulty passing urine (very rare) or other problems, you should call us at [phone] or report to the nearest emergency room.  7. Do not stay seated continuously for more than 2-3 hours for a day or two after the procedure.  Tighten your buttock muscles 10-15 times every two hours and take 10-15 deep breaths every 1-2 hours.  Do not spend more than a few minutes on the toilet if you cannot empty your bowel; instead re-visit the toilet at a later time.    I appreciate the opportunity to care for you. Harl Bowie, MD

## 2021-02-02 NOTE — Progress Notes (Signed)
PROCEDURE NOTE: The patient presents with symptomatic grade II  hemorrhoids, requesting rubber band ligation of his/her hemorrhoidal disease.  All risks, benefits and alternative forms of therapy were described and informed consent was obtained.   The anorectum was pre-medicated with 0.125% NTG and Recticare The decision was made to band the right anterior internal hemorrhoid, and the [place] was used to perform band ligation without complication.  Digital anorectal examination was then performed to assure proper positioning of the band, and to adjust the banded tissue as required.  The patient was discharged home without pain or other issues.  Dietary and behavioral recommendations were given and along with follow-up instructions.     The patient will return in 4 weeks for  follow-up and possible additional banding as required. No complications were encountered and the patient tolerated the procedure well.  Damaris Hippo , MD [phone]

## 2021-03-08 ENCOUNTER — Encounter: Payer: Self-pay | Admitting: Gastroenterology

## 2021-03-08 ENCOUNTER — Other Ambulatory Visit: Payer: Self-pay

## 2021-03-08 ENCOUNTER — Ambulatory Visit (INDEPENDENT_AMBULATORY_CARE_PROVIDER_SITE_OTHER): Payer: Medicare Other | Admitting: Gastroenterology

## 2021-03-08 VITALS — BP 104/60 | HR 80 | Ht 63.0 in | Wt 134.0 lb

## 2021-03-08 DIAGNOSIS — K641 Second degree hemorrhoids: Secondary | ICD-10-CM | POA: Diagnosis not present

## 2021-03-08 NOTE — Progress Notes (Signed)
PROCEDURE NOTE: The patient presents with symptomatic grade II  hemorrhoids, requesting rubber band ligation of his/her hemorrhoidal disease.  All risks, benefits and alternative forms of therapy were described and informed consent was obtained.   The anorectum was pre-medicated with 0.125% NTG and Recticare The decision was made to band the right posterior internal hemorrhoid, and the [place] was used to perform band ligation without complication.  Digital anorectal examination was then performed to assure proper positioning of the band, and to adjust the banded tissue as required.  The patient was discharged home without pain or other issues.  Dietary and behavioral recommendations were given and along with follow-up instructions.       The patient will return for  follow-up and possible additional banding as needed. No complications were encountered and the patient tolerated the procedure well.  Damaris Hippo , MD [phone]

## 2021-03-08 NOTE — Patient Instructions (Signed)

## 2021-03-09 DIAGNOSIS — E041 Nontoxic single thyroid nodule: Secondary | ICD-10-CM | POA: Diagnosis not present

## 2021-03-26 DIAGNOSIS — L821 Other seborrheic keratosis: Secondary | ICD-10-CM | POA: Diagnosis not present

## 2021-03-26 DIAGNOSIS — B079 Viral wart, unspecified: Secondary | ICD-10-CM | POA: Diagnosis not present

## 2021-03-26 DIAGNOSIS — Z85828 Personal history of other malignant neoplasm of skin: Secondary | ICD-10-CM | POA: Diagnosis not present

## 2021-03-26 DIAGNOSIS — L820 Inflamed seborrheic keratosis: Secondary | ICD-10-CM | POA: Diagnosis not present

## 2021-03-26 DIAGNOSIS — B009 Herpesviral infection, unspecified: Secondary | ICD-10-CM | POA: Diagnosis not present

## 2021-03-26 DIAGNOSIS — L905 Scar conditions and fibrosis of skin: Secondary | ICD-10-CM | POA: Diagnosis not present

## 2021-03-26 DIAGNOSIS — D1801 Hemangioma of skin and subcutaneous tissue: Secondary | ICD-10-CM | POA: Diagnosis not present

## 2021-06-07 ENCOUNTER — Other Ambulatory Visit: Payer: Self-pay

## 2021-06-07 ENCOUNTER — Ambulatory Visit
Admission: RE | Admit: 2021-06-07 | Discharge: 2021-06-07 | Disposition: A | Discharge status: Home / Self Care | Payer: Medicare Other | Source: Ambulatory Visit | Attending: Family Medicine | Admitting: Family Medicine

## 2021-06-07 DIAGNOSIS — M85852 Other specified disorders of bone density and structure, left thigh: Secondary | ICD-10-CM | POA: Diagnosis not present

## 2021-06-07 DIAGNOSIS — Z780 Asymptomatic menopausal state: Secondary | ICD-10-CM | POA: Diagnosis not present

## 2021-06-07 DIAGNOSIS — E2839 Other primary ovarian failure: Secondary | ICD-10-CM

## 2021-07-10 DIAGNOSIS — Z23 Encounter for immunization: Secondary | ICD-10-CM | POA: Diagnosis not present

## 2021-09-25 DIAGNOSIS — Z85828 Personal history of other malignant neoplasm of skin: Secondary | ICD-10-CM | POA: Diagnosis not present

## 2021-09-25 DIAGNOSIS — L821 Other seborrheic keratosis: Secondary | ICD-10-CM | POA: Diagnosis not present

## 2021-09-25 DIAGNOSIS — B009 Herpesviral infection, unspecified: Secondary | ICD-10-CM | POA: Diagnosis not present

## 2021-09-25 DIAGNOSIS — D1801 Hemangioma of skin and subcutaneous tissue: Secondary | ICD-10-CM | POA: Diagnosis not present

## 2021-09-25 DIAGNOSIS — L905 Scar conditions and fibrosis of skin: Secondary | ICD-10-CM | POA: Diagnosis not present

## 2021-10-26 ENCOUNTER — Other Ambulatory Visit: Payer: Self-pay | Admitting: Family Medicine

## 2021-10-26 DIAGNOSIS — Z1231 Encounter for screening mammogram for malignant neoplasm of breast: Secondary | ICD-10-CM

## 2021-11-26 DIAGNOSIS — H25813 Combined forms of age-related cataract, bilateral: Secondary | ICD-10-CM | POA: Diagnosis not present

## 2021-11-26 DIAGNOSIS — H40023 Open angle with borderline findings, high risk, bilateral: Secondary | ICD-10-CM | POA: Diagnosis not present

## 2021-11-28 DIAGNOSIS — Z23 Encounter for immunization: Secondary | ICD-10-CM | POA: Diagnosis not present

## 2021-11-29 ENCOUNTER — Ambulatory Visit
Admission: RE | Admit: 2021-11-29 | Discharge: 2021-11-29 | Disposition: A | Discharge status: Home / Self Care | Payer: Medicare Other | Source: Ambulatory Visit | Attending: Family Medicine | Admitting: Family Medicine

## 2021-11-29 DIAGNOSIS — Z1231 Encounter for screening mammogram for malignant neoplasm of breast: Secondary | ICD-10-CM | POA: Diagnosis not present

## 2022-01-04 DIAGNOSIS — Z23 Encounter for immunization: Secondary | ICD-10-CM | POA: Diagnosis not present

## 2022-01-10 ENCOUNTER — Other Ambulatory Visit: Payer: Medicare Other

## 2022-01-16 ENCOUNTER — Encounter: Payer: Medicare Other | Admitting: Family Medicine

## 2022-01-17 ENCOUNTER — Encounter: Payer: Medicare Other | Admitting: Family Medicine

## 2022-01-18 ENCOUNTER — Encounter: Payer: Medicare Other | Admitting: Family Medicine

## 2022-02-11 ENCOUNTER — Telehealth: Payer: Self-pay | Admitting: Family Medicine

## 2022-02-11 DIAGNOSIS — Z86010 Personal history of colonic polyps: Secondary | ICD-10-CM

## 2022-02-11 DIAGNOSIS — E041 Nontoxic single thyroid nodule: Secondary | ICD-10-CM

## 2022-02-11 DIAGNOSIS — E875 Hyperkalemia: Secondary | ICD-10-CM

## 2022-02-11 DIAGNOSIS — E559 Vitamin D deficiency, unspecified: Secondary | ICD-10-CM

## 2022-02-11 DIAGNOSIS — E7800 Pure hypercholesterolemia, unspecified: Secondary | ICD-10-CM

## 2022-02-11 NOTE — Telephone Encounter (Signed)
-----   Message from Ellamae Sia sent at 01/28/2022  9:46 AM EST ----- Regarding: Lab orders for Tuesdsay, 2.21.23 Patient is scheduled for CPX labs, please order future labs, Thanks , Karna Christmas

## 2022-02-12 ENCOUNTER — Other Ambulatory Visit (INDEPENDENT_AMBULATORY_CARE_PROVIDER_SITE_OTHER): Payer: Medicare Other

## 2022-02-12 ENCOUNTER — Other Ambulatory Visit: Payer: Self-pay

## 2022-02-12 DIAGNOSIS — E041 Nontoxic single thyroid nodule: Secondary | ICD-10-CM

## 2022-02-12 DIAGNOSIS — E559 Vitamin D deficiency, unspecified: Secondary | ICD-10-CM

## 2022-02-12 DIAGNOSIS — Z86010 Personal history of colonic polyps: Secondary | ICD-10-CM | POA: Diagnosis not present

## 2022-02-12 DIAGNOSIS — E875 Hyperkalemia: Secondary | ICD-10-CM | POA: Diagnosis not present

## 2022-02-12 DIAGNOSIS — E7800 Pure hypercholesterolemia, unspecified: Secondary | ICD-10-CM | POA: Diagnosis not present

## 2022-02-12 LAB — LIPID PANEL
Cholesterol: 270 mg/dL — ABNORMAL HIGH (ref 0–200)
HDL: 75.5 mg/dL (ref >39.00)
LDL Cholesterol: 175 mg/dL — ABNORMAL HIGH (ref 0–99)
NonHDL: 194.72
Total CHOL/HDL Ratio: 4
Triglycerides: 100 mg/dL (ref 0.0–149.0)
VLDL: 20 mg/dL (ref 0.0–40.0)

## 2022-02-12 LAB — CBC WITH DIFFERENTIAL/PLATELET
Basophils Absolute: 0 10*3/uL (ref 0.0–0.1)
Basophils Relative: 1.1 % (ref 0.0–3.0)
Eosinophils Absolute: 0 10*3/uL (ref 0.0–0.7)
Eosinophils Relative: 1.1 % (ref 0.0–5.0)
HCT: 41.1 % (ref 36.0–46.0)
Hemoglobin: 13.3 g/dL (ref 12.0–15.0)
Lymphocytes Relative: 27.4 % (ref 12.0–46.0)
Lymphs Abs: 1 10*3/uL (ref 0.7–4.0)
MCHC: 32.5 g/dL (ref 30.0–36.0)
MCV: 87.6 fl (ref 78.0–100.0)
Monocytes Absolute: 0.3 10*3/uL (ref 0.1–1.0)
Monocytes Relative: 8.2 % (ref 3.0–12.0)
Neutro Abs: 2.4 10*3/uL (ref 1.4–7.7)
Neutrophils Relative %: 62.2 % (ref 43.0–77.0)
Platelets: 204 10*3/uL (ref 150.0–400.0)
RBC: 4.69 Mil/uL (ref 3.87–5.11)
RDW: 14.5 % (ref 11.5–15.5)
WBC: 3.8 10*3/uL — ABNORMAL LOW (ref 4.0–10.5)

## 2022-02-12 LAB — COMPREHENSIVE METABOLIC PANEL
ALT: 16 U/L (ref 0–35)
AST: 21 U/L (ref 0–37)
Albumin: 4.6 g/dL (ref 3.5–5.2)
Alkaline Phosphatase: 75 U/L (ref 39–117)
BUN: 16 mg/dL (ref 6–23)
CO2: 33 mEq/L — ABNORMAL HIGH (ref 19–32)
Calcium: 9.8 mg/dL (ref 8.4–10.5)
Chloride: 104 mEq/L (ref 96–112)
Creatinine, Ser: 0.88 mg/dL (ref 0.40–1.20)
GFR: 63.6 mL/min (ref >60.00)
Glucose, Bld: 96 mg/dL (ref 70–99)
Potassium: 5.1 mEq/L (ref 3.5–5.1)
Sodium: 141 mEq/L (ref 135–145)
Total Bilirubin: 0.6 mg/dL (ref 0.2–1.2)
Total Protein: 7.2 g/dL (ref 6.0–8.3)

## 2022-02-12 LAB — VITAMIN D 25 HYDROXY (VIT D DEFICIENCY, FRACTURES): VITD: 61.59 ng/mL (ref 30.00–100.00)

## 2022-02-12 LAB — TSH: TSH: 2.28 u[IU]/mL (ref 0.35–5.50)

## 2022-02-19 ENCOUNTER — Ambulatory Visit (INDEPENDENT_AMBULATORY_CARE_PROVIDER_SITE_OTHER): Payer: Medicare Other | Admitting: Family Medicine

## 2022-02-19 ENCOUNTER — Encounter: Payer: Self-pay | Admitting: Family Medicine

## 2022-02-19 ENCOUNTER — Other Ambulatory Visit: Payer: Self-pay

## 2022-02-19 VITALS — BP 122/76 | HR 72 | Temp 97.7°F | Ht 63.0 in | Wt 137.1 lb

## 2022-02-19 DIAGNOSIS — M8589 Other specified disorders of bone density and structure, multiple sites: Secondary | ICD-10-CM | POA: Diagnosis not present

## 2022-02-19 DIAGNOSIS — E559 Vitamin D deficiency, unspecified: Secondary | ICD-10-CM

## 2022-02-19 DIAGNOSIS — E7800 Pure hypercholesterolemia, unspecified: Secondary | ICD-10-CM

## 2022-02-19 DIAGNOSIS — Z0000 Encounter for general adult medical examination without abnormal findings: Secondary | ICD-10-CM | POA: Diagnosis not present

## 2022-02-19 DIAGNOSIS — E785 Hyperlipidemia, unspecified: Secondary | ICD-10-CM | POA: Diagnosis not present

## 2022-02-19 MED ORDER — EZETIMIBE 10 MG PO TABS: 10.0000 mg | ORAL_TABLET | Freq: Every day | ORAL | 3 refills | Status: DC

## 2022-02-19 NOTE — Patient Instructions (Addendum)
Try the generic zetia one pill daily  Re check cholesterol in 8 weeks  If not improved we will stop it   For cholesterol Avoid red meat/ fried foods/ egg yolks/ fatty breakfast meats/ butter, cheese and high fat dairy/ and shellfish    [name] should be a treat instead of regular   Take care of yourself  Use sun protection  Stay active

## 2022-02-19 NOTE — Assessment & Plan Note (Signed)
dexa 05/2021 No falls or fractures  Taking vit D and mvi  D level therapeutic  Good exercise habits

## 2022-02-19 NOTE — Assessment & Plan Note (Signed)
Level os 61 with current supplementation   Vitamin D level is therapeutic with current supplementation Disc importance of this to bone and overall health

## 2022-02-19 NOTE — Progress Notes (Signed)
Subjective:    Patient ID: Melissa Blackwell, female    DOB: [birth year], 76 y.o.   MRN: [id number]  This visit occurred during the SARS-CoV-2 public health emergency.  Safety protocls were in place, including screening questions prior to the visit, additional usage of staff PPE, and extensive cleaning of exam room while observing appropriate contact time as indicated for disinfecting solutions.   HPI Pt presents for amw and annual f/u of chronic health problems   I have personally reviewed the Medicare Annual Wellness questionnaire and have noted 1. The patient's medical and social history 2. Their use of alcohol, tobacco or illicit drugs 3. Their current medications and supplements 4. The patient's functional ability including ADL's, fall risks, home safety risks and hearing or visual             impairment. 5. Diet and physical activities 6. Evidence for depression or mood disorders  The patients weight, height, BMI have been recorded in the chart and visual acuity is per eye clinic.  I have made referrals, counseling and provided education to the patient based review of the above and I have provided the pt with a written personalized care plan for preventive services. Reviewed and updated provider list, see scanned forms.  See scanned forms.  Routine anticipatory guidance given to patient.  See health maintenance. Colon cancer screening colonoscopy 11/2020  Breast cancer screening  11/2021 mammogram  Self breast exam no lumps  Flu vaccine 11/2021 Tetanus vaccine   10/2013 Td  Pneumovax- declines  Covid immunization 2021 Zoster vaccine had shingrix series Dexa  05/2021 Osteopenia  Falls-none  Fractures- none  Supplements takes vit D and mvi  D level is 61.5 Exercise - gym    Advance directive- up to date  Cognitive function addressed- see scanned forms- and if abnormal then additional documentation follows.   No problems with memory and cognition  Doing very well  Handles  all of her own affairs   PMH and SH reviewed  Meds, vitals, and allergies reviewed.   ROS: See HPI.  Otherwise negative.    Weight : Wt Readings from Last 3 Encounters:  02/19/22 137 lb 2 oz (62.2 kg)  03/08/21 134 lb (60.8 kg)  02/02/21 135 lb (61.2 kg)   24.29 kg/m  Keeping busy Takes care of herself   Goes to the gym regularly  That helps   Hearing/vision: Hearing Screening   500Hz  1000Hz  2000Hz  4000Hz   Right ear 40 40 0 0  Left ear 40 40 40 0  Vision Screening - Comments:: Eye Exam in Dec 2022 at [facility] In [place]  She did get hearing aides and they drove her crazy-everything sounded shrill  Does not wear them now very often   PHQ: Depression screen [facility] 2/9 02/19/2022 01/15/2021 01/04/2020 12/18/2018 12/10/2017  Decreased Interest 0 0 0 0 0  Down, Depressed, Hopeless 0 0 0 0 0  PHQ - 2 Score 0 0 0 0 0  Altered sleeping - - - 0 -  Tired, decreased energy - - - 0 -  Change in appetite - - - 0 -  Feeling bad or failure about yourself  - - - 0 -  Moving slowly or fidgety/restless - - - 0 -  Suicidal thoughts - - - 0 -  PHQ-9 Score - - - 0 -  Difficult doing work/chores - - - Not difficult at all -     ADLs: no help needed   Functionality: very  good   Care team : Megin Consalvo-[facility]   BP Readings from Last 3 Encounters:  02/19/22 122/76  03/08/21 104/60  02/02/21 110/70   Pulse Readings from Last 3 Encounters:  02/19/22 72  03/08/21 80  02/02/21 75     Hyperlipidemia Lab Results  Component Value Date   CHOL 270 (H) 02/12/2022   CHOL 249 (H) 01/01/2021   CHOL 250 (H) 12/27/2019   Lab Results  Component Value Date   HDL 75.50 02/12/2022   HDL 74.10 01/01/2021   HDL 68.60 12/27/2019   Lab Results  Component Value Date   LDLCALC 175 (H) 02/12/2022   LDLCALC 155 (H) 01/01/2021   LDLCALC 160 (H) 12/27/2019   Lab Results  Component Value Date   TRIG 100.0 02/12/2022   TRIG 97.0 01/01/2021   TRIG 110.0  12/27/2019   Lab Results  Component Value Date   CHOLHDL 4 02/12/2022   CHOLHDL 3 01/01/2021   CHOLHDL 4 12/27/2019   Lab Results  Component Value Date   LDLDIRECT 124.9 10/14/2013   LDLDIRECT 148.5 10/07/2012   LDLDIRECT 129.0 10/10/2011  Has declined medication of any kind in the past  LDL is up significantly   Diet is no different  Egg and 1 pc of bacon  Occ chips or fries   Stopped red yeast rice  Changed to a different thing   Open to zetia now but not a statin   Other labs  Lab Results  Component Value Date   WBC 3.8 (L) 02/12/2022   HGB 13.3 02/12/2022   HCT 41.1 02/12/2022   MCV 87.6 02/12/2022   PLT 204.0 02/12/2022   Lab Results  Component Value Date   TSH 2.28 02/12/2022    Lab Results  Component Value Date   CREATININE 0.88 02/12/2022   BUN 16 02/12/2022   NA 141 02/12/2022   K 5.1 02/12/2022   CL 104 02/12/2022   CO2 33 (H) 02/12/2022   Lab Results  Component Value Date   ALT 16 02/12/2022   AST 21 02/12/2022   ALKPHOS 75 02/12/2022   BILITOT 0.6 02/12/2022    Had a carotid artery check  Noted her thyroid nodule -is checked annually by [facility] in [facility]   Patient Active Problem List   Diagnosis Date Noted   Thyroid nodule 01/04/2020   Estrogen deficiency 11/20/2016   Vitamin D deficiency 10/31/2014   Hyperlipidemia 10/31/2014   Encounter for Medicare annual wellness exam 10/13/2013   Other screening mammogram 10/15/2011   DIVERTICULOSIS, COLON 08/23/2010   COLONIC POLYPS, ADENOMATOUS, HX OF 08/23/2010   History of Lyme disease 08/10/2007   Hemorrhoids 07/21/2007   CONSTIPATION, CHRONIC 07/21/2007   Osteopenia 07/21/2007   RHEUMATOID FACTOR, POSITIVE 07/21/2007   SKIN CANCER, HX OF 07/21/2007   Past Medical History:  Diagnosis Date   Allergy    Cancer ([place])    skin CA Hx of squamous cell in situ   Diverticulosis of sigmoid colon    mild   GI bleed    Hx of adenomatous colonic polyps    Hyperlipidemia    Internal hemorrhoids     Lyme disease 06/2007   Dr Knox Royalty   Osteopenia    Renal cyst    right renal cyst   Thyroid nodule    Past Surgical History:  Procedure Laterality Date   ABDOMINAL HYSTERECTOMY  03/1996   partial fibroids   COLONOSCOPY     hemorrhoid banding     HEMORROIDECTOMY  04/2001   RHINOPLASTY     scar removal     keloid  scar   WRIST FRACTURE SURGERY     hardware  placed  and removed    Social History   Tobacco Use   Smoking status: Never   Smokeless tobacco: Never  Vaping Use   Vaping Use: Never used  Substance Use Topics   Alcohol use: Yes    Alcohol/week: 0.0 standard drinks    Comment: rarely - twice a year   Drug use: No   Family History  Problem Relation Age of Onset   Cancer Mother        pancreatic CA   Heart disease Mother    Pancreatic cancer Mother    Heart disease Father        CHF and heart problems   Hyperlipidemia Sister    Cancer Sister        uterine CA   Uterine cancer Sister    Heart disease Maternal Grandmother    Colon polyps Daughter    Colon cancer Neg Hx    Esophageal cancer Neg Hx    Stomach cancer Neg Hx    Rectal cancer Neg Hx    No Known Allergies Current Outpatient Medications on File Prior to Visit  Medication Sig Dispense Refill   aspirin 81 MG chewable tablet Chew by mouth daily.     Biotin 10000 MCG TABS Take by mouth.     Chlorophyll 100 MG TABS Take by mouth.     Cholecalciferol (VITAMIN D3) 5000 UNITS CAPS Take 5,000 Units by mouth daily.     diphenhydrAMINE (BENADRYL) 25 MG tablet Take 25 mg by mouth at bedtime.     KRILL OIL PO Take by mouth.     MELATONIN-MAGNESIUM CITRATE PO Take by mouth.     Multiple Vitamin (MULTIVITAMIN) capsule Take 1 capsule by mouth daily.     OVER THE COUNTER MEDICATION Take 2 tablets by mouth daily. Bone Restore     OVER THE COUNTER MEDICATION Take 2 tablets by mouth in the morning and at bedtime. Cholest Solve 24/7     Probiotic Product (PROBIOTIC PO) Take 1 capsule by mouth daily.      RESVERATROL PO Take by mouth.     Turmeric (CURCUMIN 95 PO) Take by mouth.     No current facility-administered medications on file prior to visit.     Review of Systems  Constitutional:  Negative for activity change, appetite change, fatigue, fever and unexpected weight change.  HENT:  Negative for congestion, ear pain, rhinorrhea, sinus pressure and sore throat.   Eyes:  Negative for pain, redness and visual disturbance.  Respiratory:  Negative for cough, shortness of breath and wheezing.   Cardiovascular:  Negative for chest pain and palpitations.  Gastrointestinal:  Negative for abdominal pain, blood in stool, constipation and diarrhea.  Endocrine: Negative for polydipsia and polyuria.  Genitourinary:  Negative for dysuria, frequency and urgency.  Musculoskeletal:  Positive for arthralgias. Negative for back pain and myalgias.  Skin:  Negative for pallor and rash.  Allergic/Immunologic: Negative for environmental allergies.  Neurological:  Negative for dizziness, syncope and headaches.  Hematological:  Negative for adenopathy. Does not bruise/bleed easily.  Psychiatric/Behavioral:  Negative for decreased concentration and dysphoric mood. The patient is not nervous/anxious.       Objective:   Physical Exam Constitutional:      General: She is not in acute distress.    Appearance: Normal appearance. She is well-developed and  normal weight. She is not ill-appearing or diaphoretic.  HENT:     Head: Normocephalic and atraumatic.     Right Ear: Tympanic membrane, ear canal and external ear normal.     Left Ear: Tympanic membrane, ear canal and external ear normal.     Nose: Nose normal. No congestion.     Mouth/Throat:     Mouth: Mucous membranes are moist.     Pharynx: Oropharynx is clear. No posterior oropharyngeal erythema.  Eyes:     General: No scleral icterus.    Extraocular Movements: Extraocular movements intact.     Conjunctiva/sclera: Conjunctivae normal.     Pupils:  Pupils are equal, round, and reactive to light.  Neck:     Thyroid: No thyromegaly.     Vascular: No carotid bruit or JVD.  Cardiovascular:     Rate and Rhythm: Normal rate and regular rhythm.     Pulses: Normal pulses.     Heart sounds: Normal heart sounds.    No gallop.  Pulmonary:     Effort: Pulmonary effort is normal. No respiratory distress.     Breath sounds: Normal breath sounds. No wheezing.     Comments: Good air exch Chest:     Chest wall: No tenderness.  Abdominal:     General: Bowel sounds are normal. There is no distension or abdominal bruit.     Palpations: Abdomen is soft. There is no mass.     Tenderness: There is no abdominal tenderness.     Hernia: No hernia is present.  Genitourinary:    Comments: Breast exam: No mass, nodules, thickening, tenderness, bulging, retraction, inflamation, nipple discharge or skin changes noted.  No axillary or clavicular LA.     Musculoskeletal:        General: No tenderness. Normal range of motion.     Cervical back: Normal range of motion and neck supple. No rigidity. No muscular tenderness.     Right lower leg: No edema.     Left lower leg: No edema.     Comments: No kyphosis   Lymphadenopathy:     Cervical: No cervical adenopathy.  Skin:    General: Skin is warm and dry.     Coloration: Skin is not pale.     Findings: No erythema or rash.     Comments: Solar lentigines diffusely Some sks  Neurological:     Mental Status: She is alert. Mental status is at baseline.     Cranial Nerves: No cranial nerve deficit.     Motor: No abnormal muscle tone.     Coordination: Coordination normal.     Gait: Gait normal.     Deep Tendon Reflexes: Reflexes are normal and symmetric. Reflexes normal.  Psychiatric:        Mood and Affect: Mood normal.        Cognition and Memory: Cognition and memory normal.          Assessment & Plan:   Problem List Items Addressed This Visit       Musculoskeletal and Integument   Osteopenia     dexa 05/2021 No falls or fractures  Taking vit D and mvi  D level therapeutic  Good exercise habits         Other   Encounter for Medicare annual wellness exam - Primary    Reviewed health habits including diet and exercise and skin cancer prevention Reviewed appropriate screening tests for age  Also reviewed health mt list, fam hx and  immunization status , as well as social and family history   See HPI Labs reviewed Colonoscopy utd Mammogram utd  Declines pna vaccine  dexa utd, no falls or fx and good exercise  Advance directive is utd No cognitive concerns Hearing screen reviewed Vision and eye exam up to date  PHQ score of 0 High fxn, no hep needed for ADLs Care team reviewed       Hyperlipidemia    Disc goals for lipids and reasons to control them Rev last labs with pt Rev low sat fat diet in detail LDL climbed to 175  Pt eats eggs and bacon daily /enc her to change diet  Declines statin med She would like to try zetia 10 mg daily by it self  Explained that this may help some-will try it and report any side effects and re check lipd in 8 wk      Relevant Medications   ezetimibe (ZETIA) 10 MG tablet   Vitamin D deficiency    Level os 61 with current supplementation   Vitamin D level is therapeutic with current supplementation Disc importance of this to bone and overall health

## 2022-02-19 NOTE — Assessment & Plan Note (Signed)
Disc goals for lipids and reasons to control them Rev last labs with pt Rev low sat fat diet in detail LDL climbed to 175  Pt eats eggs and bacon daily /enc her to change diet  Declines statin med She would like to try zetia 10 mg daily by it self  Explained that this may help some-will try it and report any side effects and re check lipd in 8 wk

## 2022-02-19 NOTE — Assessment & Plan Note (Signed)
Reviewed health habits including diet and exercise and skin cancer prevention Reviewed appropriate screening tests for age  Also reviewed health mt list, fam hx and immunization status , as well as social and family history   See HPI Labs reviewed Colonoscopy utd Mammogram utd  Declines pna vaccine  dexa utd, no falls or fx and good exercise  Advance directive is utd No cognitive concerns Hearing screen reviewed Vision and eye exam up to date  PHQ score of 0 High fxn, no hep needed for ADLs Care team reviewed

## 2022-02-20 DIAGNOSIS — R350 Frequency of micturition: Secondary | ICD-10-CM | POA: Diagnosis not present

## 2022-02-20 DIAGNOSIS — N281 Cyst of kidney, acquired: Secondary | ICD-10-CM | POA: Diagnosis not present

## 2022-02-25 DIAGNOSIS — M2040 Other hammer toe(s) (acquired), unspecified foot: Secondary | ICD-10-CM | POA: Diagnosis not present

## 2022-02-25 DIAGNOSIS — M24576 Contracture, unspecified foot: Secondary | ICD-10-CM | POA: Diagnosis not present

## 2022-02-25 DIAGNOSIS — M79671 Pain in right foot: Secondary | ICD-10-CM | POA: Diagnosis not present

## 2022-02-25 DIAGNOSIS — M24574 Contracture, right foot: Secondary | ICD-10-CM | POA: Diagnosis not present

## 2022-02-25 DIAGNOSIS — M79673 Pain in unspecified foot: Secondary | ICD-10-CM | POA: Diagnosis not present

## 2022-02-25 DIAGNOSIS — M7671 Peroneal tendinitis, right leg: Secondary | ICD-10-CM | POA: Diagnosis not present

## 2022-02-25 DIAGNOSIS — M79672 Pain in left foot: Secondary | ICD-10-CM | POA: Diagnosis not present

## 2022-02-25 DIAGNOSIS — M2010 Hallux valgus (acquired), unspecified foot: Secondary | ICD-10-CM | POA: Diagnosis not present

## 2022-03-13 DIAGNOSIS — N2889 Other specified disorders of kidney and ureter: Secondary | ICD-10-CM | POA: Diagnosis not present

## 2022-03-13 DIAGNOSIS — K839 Disease of biliary tract, unspecified: Secondary | ICD-10-CM | POA: Diagnosis not present

## 2022-03-13 DIAGNOSIS — N281 Cyst of kidney, acquired: Secondary | ICD-10-CM | POA: Diagnosis not present

## 2022-03-13 DIAGNOSIS — K7689 Other specified diseases of liver: Secondary | ICD-10-CM | POA: Diagnosis not present

## 2022-03-20 DIAGNOSIS — N281 Cyst of kidney, acquired: Secondary | ICD-10-CM | POA: Diagnosis not present

## 2022-03-25 DIAGNOSIS — Z85828 Personal history of other malignant neoplasm of skin: Secondary | ICD-10-CM | POA: Diagnosis not present

## 2022-03-25 DIAGNOSIS — L720 Epidermal cyst: Secondary | ICD-10-CM | POA: Diagnosis not present

## 2022-03-25 DIAGNOSIS — L820 Inflamed seborrheic keratosis: Secondary | ICD-10-CM | POA: Diagnosis not present

## 2022-03-25 DIAGNOSIS — D1801 Hemangioma of skin and subcutaneous tissue: Secondary | ICD-10-CM | POA: Diagnosis not present

## 2022-03-25 DIAGNOSIS — L821 Other seborrheic keratosis: Secondary | ICD-10-CM | POA: Diagnosis not present

## 2022-03-25 DIAGNOSIS — L905 Scar conditions and fibrosis of skin: Secondary | ICD-10-CM | POA: Diagnosis not present

## 2022-04-16 ENCOUNTER — Telehealth: Payer: Self-pay

## 2022-04-16 NOTE — Telephone Encounter (Signed)
That is how I have to bill it if she does not have an medicare advantage plan.   The amw does not cover annual follow up (just the medicare questions)  ?Please route to Amy or Lea if needed or if I did not bill it correctly  ?

## 2022-04-16 NOTE — Telephone Encounter (Signed)
Patient states she got a bill from her visit on 02/19/22 that was her Medicare Wellness visit and it shows that she was charged for Wellness visit and an OV. She has not had this before and wanted to see what happened. ?

## 2022-04-22 NOTE — Telephone Encounter (Signed)
Charge corrected resubmitted claim. ? ?They corrected 04136-43 dx to e78.00, e55.9 M85.89.  ?  ? ?

## 2022-04-22 NOTE — Telephone Encounter (Signed)
Patient called office. Given information in message she will let us know if continues to get bill.  ?

## 2022-04-23 ENCOUNTER — Telehealth: Payer: Self-pay | Admitting: Family Medicine

## 2022-04-23 ENCOUNTER — Ambulatory Visit: Payer: Medicare Other | Admitting: Family Medicine

## 2022-04-23 ENCOUNTER — Other Ambulatory Visit (INDEPENDENT_AMBULATORY_CARE_PROVIDER_SITE_OTHER): Payer: Medicare Other

## 2022-04-23 ENCOUNTER — Ambulatory Visit: Payer: Medicare Other

## 2022-04-23 DIAGNOSIS — E7800 Pure hypercholesterolemia, unspecified: Secondary | ICD-10-CM | POA: Diagnosis not present

## 2022-04-23 LAB — LIPID PANEL
Cholesterol: 201 mg/dL — ABNORMAL HIGH (ref 0–200)
HDL: 78.8 mg/dL (ref >39.00)
LDL Cholesterol: 110 mg/dL — ABNORMAL HIGH (ref 0–99)
NonHDL: 122.57
Total CHOL/HDL Ratio: 3
Triglycerides: 62 mg/dL (ref 0.0–149.0)
VLDL: 12.4 mg/dL (ref 0.0–40.0)

## 2022-04-23 LAB — ALT: ALT: 19 U/L (ref 0–35)

## 2022-04-23 LAB — AST: AST: 24 U/L (ref 0–37)

## 2022-04-23 NOTE — Telephone Encounter (Signed)
Future labs for cholesterol  ?

## 2022-04-24 DIAGNOSIS — E041 Nontoxic single thyroid nodule: Secondary | ICD-10-CM | POA: Diagnosis not present

## 2022-05-10 DIAGNOSIS — H6121 Impacted cerumen, right ear: Secondary | ICD-10-CM | POA: Diagnosis not present

## 2022-09-12 DIAGNOSIS — M19032 Primary osteoarthritis, left wrist: Secondary | ICD-10-CM | POA: Diagnosis not present

## 2022-09-12 DIAGNOSIS — M1811 Unilateral primary osteoarthritis of first carpometacarpal joint, right hand: Secondary | ICD-10-CM | POA: Diagnosis not present

## 2022-09-12 DIAGNOSIS — M25532 Pain in left wrist: Secondary | ICD-10-CM | POA: Diagnosis not present

## 2022-09-12 DIAGNOSIS — M25531 Pain in right wrist: Secondary | ICD-10-CM | POA: Diagnosis not present

## 2022-09-12 DIAGNOSIS — M19031 Primary osteoarthritis, right wrist: Secondary | ICD-10-CM | POA: Diagnosis not present

## 2022-09-26 DIAGNOSIS — Z23 Encounter for immunization: Secondary | ICD-10-CM | POA: Diagnosis not present

## 2022-09-30 DIAGNOSIS — L820 Inflamed seborrheic keratosis: Secondary | ICD-10-CM | POA: Diagnosis not present

## 2022-09-30 DIAGNOSIS — B009 Herpesviral infection, unspecified: Secondary | ICD-10-CM | POA: Diagnosis not present

## 2022-09-30 DIAGNOSIS — L905 Scar conditions and fibrosis of skin: Secondary | ICD-10-CM | POA: Diagnosis not present

## 2022-09-30 DIAGNOSIS — L821 Other seborrheic keratosis: Secondary | ICD-10-CM | POA: Diagnosis not present

## 2022-09-30 DIAGNOSIS — Z85828 Personal history of other malignant neoplasm of skin: Secondary | ICD-10-CM | POA: Diagnosis not present

## 2022-11-14 ENCOUNTER — Encounter: Payer: Self-pay | Admitting: Family Medicine

## 2022-12-19 DIAGNOSIS — M9903 Segmental and somatic dysfunction of lumbar region: Secondary | ICD-10-CM | POA: Diagnosis not present

## 2022-12-19 DIAGNOSIS — S13160A Subluxation of C5/C6 cervical vertebrae, initial encounter: Secondary | ICD-10-CM | POA: Diagnosis not present

## 2022-12-19 DIAGNOSIS — M9901 Segmental and somatic dysfunction of cervical region: Secondary | ICD-10-CM | POA: Diagnosis not present

## 2022-12-19 DIAGNOSIS — S33140A Subluxation of L4/L5 lumbar vertebra, initial encounter: Secondary | ICD-10-CM | POA: Diagnosis not present

## 2022-12-26 DIAGNOSIS — M9903 Segmental and somatic dysfunction of lumbar region: Secondary | ICD-10-CM | POA: Diagnosis not present

## 2022-12-26 DIAGNOSIS — M9901 Segmental and somatic dysfunction of cervical region: Secondary | ICD-10-CM | POA: Diagnosis not present

## 2022-12-26 DIAGNOSIS — S13160A Subluxation of C5/C6 cervical vertebrae, initial encounter: Secondary | ICD-10-CM | POA: Diagnosis not present

## 2022-12-26 DIAGNOSIS — S33140A Subluxation of L4/L5 lumbar vertebra, initial encounter: Secondary | ICD-10-CM | POA: Diagnosis not present

## 2023-01-02 DIAGNOSIS — M9903 Segmental and somatic dysfunction of lumbar region: Secondary | ICD-10-CM | POA: Diagnosis not present

## 2023-01-02 DIAGNOSIS — S33140A Subluxation of L4/L5 lumbar vertebra, initial encounter: Secondary | ICD-10-CM | POA: Diagnosis not present

## 2023-01-02 DIAGNOSIS — M9901 Segmental and somatic dysfunction of cervical region: Secondary | ICD-10-CM | POA: Diagnosis not present

## 2023-01-02 DIAGNOSIS — S13160A Subluxation of C5/C6 cervical vertebrae, initial encounter: Secondary | ICD-10-CM | POA: Diagnosis not present

## 2023-01-13 ENCOUNTER — Other Ambulatory Visit: Payer: Self-pay | Admitting: Family Medicine

## 2023-01-13 DIAGNOSIS — Z1231 Encounter for screening mammogram for malignant neoplasm of breast: Secondary | ICD-10-CM

## 2023-01-27 ENCOUNTER — Ambulatory Visit
Admission: RE | Admit: 2023-01-27 | Discharge: 2023-01-27 | Disposition: A | Discharge status: Home / Self Care | Payer: Medicare Other | Source: Ambulatory Visit | Attending: Family Medicine | Admitting: Family Medicine

## 2023-01-27 DIAGNOSIS — Z1231 Encounter for screening mammogram for malignant neoplasm of breast: Secondary | ICD-10-CM | POA: Diagnosis not present

## 2023-01-30 DIAGNOSIS — S33140A Subluxation of L4/L5 lumbar vertebra, initial encounter: Secondary | ICD-10-CM | POA: Diagnosis not present

## 2023-01-30 DIAGNOSIS — M9901 Segmental and somatic dysfunction of cervical region: Secondary | ICD-10-CM | POA: Diagnosis not present

## 2023-01-30 DIAGNOSIS — M9903 Segmental and somatic dysfunction of lumbar region: Secondary | ICD-10-CM | POA: Diagnosis not present

## 2023-01-30 DIAGNOSIS — S13160A Subluxation of C5/C6 cervical vertebrae, initial encounter: Secondary | ICD-10-CM | POA: Diagnosis not present

## 2023-02-13 DIAGNOSIS — M9903 Segmental and somatic dysfunction of lumbar region: Secondary | ICD-10-CM | POA: Diagnosis not present

## 2023-02-13 DIAGNOSIS — S13160A Subluxation of C5/C6 cervical vertebrae, initial encounter: Secondary | ICD-10-CM | POA: Diagnosis not present

## 2023-02-13 DIAGNOSIS — S33140A Subluxation of L4/L5 lumbar vertebra, initial encounter: Secondary | ICD-10-CM | POA: Diagnosis not present

## 2023-02-13 DIAGNOSIS — M9901 Segmental and somatic dysfunction of cervical region: Secondary | ICD-10-CM | POA: Diagnosis not present

## 2023-02-17 ENCOUNTER — Telehealth: Payer: Self-pay | Admitting: Family Medicine

## 2023-02-17 DIAGNOSIS — Z86010 Personal history of colonic polyps: Secondary | ICD-10-CM

## 2023-02-17 DIAGNOSIS — E041 Nontoxic single thyroid nodule: Secondary | ICD-10-CM

## 2023-02-17 DIAGNOSIS — E559 Vitamin D deficiency, unspecified: Secondary | ICD-10-CM

## 2023-02-17 DIAGNOSIS — Z8619 Personal history of other infectious and parasitic diseases: Secondary | ICD-10-CM

## 2023-02-17 DIAGNOSIS — E7800 Pure hypercholesterolemia, unspecified: Secondary | ICD-10-CM

## 2023-02-17 NOTE — Telephone Encounter (Signed)
-----   Message from Velna Hatchet, RT sent at 02/04/2023  4:31 PM EST ----- Regarding: Tue 2/27 lab Patient is scheduled for cpx, please order future labs.  Thanks, Anda Kraft

## 2023-02-18 ENCOUNTER — Other Ambulatory Visit (INDEPENDENT_AMBULATORY_CARE_PROVIDER_SITE_OTHER): Payer: Medicare Other

## 2023-02-18 DIAGNOSIS — E7800 Pure hypercholesterolemia, unspecified: Secondary | ICD-10-CM

## 2023-02-18 DIAGNOSIS — Z860100 Personal history of colon polyps, unspecified: Secondary | ICD-10-CM

## 2023-02-18 DIAGNOSIS — E041 Nontoxic single thyroid nodule: Secondary | ICD-10-CM | POA: Diagnosis not present

## 2023-02-18 DIAGNOSIS — Z8619 Personal history of other infectious and parasitic diseases: Secondary | ICD-10-CM | POA: Diagnosis not present

## 2023-02-18 DIAGNOSIS — E559 Vitamin D deficiency, unspecified: Secondary | ICD-10-CM | POA: Diagnosis not present

## 2023-02-18 LAB — COMPREHENSIVE METABOLIC PANEL
ALT: 16 U/L (ref 0–35)
AST: 19 U/L (ref 0–37)
Albumin: 4.2 g/dL (ref 3.5–5.2)
Alkaline Phosphatase: 89 U/L (ref 39–117)
BUN: 16 mg/dL (ref 6–23)
CO2: 30 mEq/L (ref 19–32)
Calcium: 9.9 mg/dL (ref 8.4–10.5)
Chloride: 104 mEq/L (ref 96–112)
Creatinine, Ser: 0.73 mg/dL (ref 0.40–1.20)
GFR: 79.02 mL/min (ref >60.00)
Glucose, Bld: 90 mg/dL (ref 70–99)
Potassium: 4.6 mEq/L (ref 3.5–5.1)
Sodium: 140 mEq/L (ref 135–145)
Total Bilirubin: 0.5 mg/dL (ref 0.2–1.2)
Total Protein: 6.8 g/dL (ref 6.0–8.3)

## 2023-02-18 LAB — CBC WITH DIFFERENTIAL/PLATELET
Basophils Absolute: 0 10*3/uL (ref 0.0–0.1)
Basophils Relative: 0.8 % (ref 0.0–3.0)
Eosinophils Absolute: 0 10*3/uL (ref 0.0–0.7)
Eosinophils Relative: 0.9 % (ref 0.0–5.0)
HCT: 40.2 % (ref 36.0–46.0)
Hemoglobin: 13.4 g/dL (ref 12.0–15.0)
Lymphocytes Relative: 24.4 % (ref 12.0–46.0)
Lymphs Abs: 1.1 10*3/uL (ref 0.7–4.0)
MCHC: 33.3 g/dL (ref 30.0–36.0)
MCV: 87.5 fl (ref 78.0–100.0)
Monocytes Absolute: 0.3 10*3/uL (ref 0.1–1.0)
Monocytes Relative: 7.5 % (ref 3.0–12.0)
Neutro Abs: 3.1 10*3/uL (ref 1.4–7.7)
Neutrophils Relative %: 66.4 % (ref 43.0–77.0)
Platelets: 194 10*3/uL (ref 150.0–400.0)
RBC: 4.59 Mil/uL (ref 3.87–5.11)
RDW: 13.9 % (ref 11.5–15.5)
WBC: 4.6 10*3/uL (ref 4.0–10.5)

## 2023-02-18 LAB — LIPID PANEL
Cholesterol: 252 mg/dL — ABNORMAL HIGH (ref 0–200)
HDL: 76.8 mg/dL (ref >39.00)
LDL Cholesterol: 156 mg/dL — ABNORMAL HIGH (ref 0–99)
NonHDL: 175.45
Total CHOL/HDL Ratio: 3
Triglycerides: 96 mg/dL (ref 0.0–149.0)
VLDL: 19.2 mg/dL (ref 0.0–40.0)

## 2023-02-18 LAB — VITAMIN D 25 HYDROXY (VIT D DEFICIENCY, FRACTURES): VITD: 58.7 ng/mL (ref 30.00–100.00)

## 2023-02-18 LAB — TSH: TSH: 2.17 u[IU]/mL (ref 0.35–5.50)

## 2023-02-20 ENCOUNTER — Ambulatory Visit (INDEPENDENT_AMBULATORY_CARE_PROVIDER_SITE_OTHER): Payer: Medicare Other

## 2023-02-20 VITALS — Ht 63.0 in | Wt 130.0 lb

## 2023-02-20 DIAGNOSIS — Z780 Asymptomatic menopausal state: Secondary | ICD-10-CM

## 2023-02-20 DIAGNOSIS — Z0000 Encounter for general adult medical examination without abnormal findings: Secondary | ICD-10-CM

## 2023-02-20 NOTE — Progress Notes (Signed)
I connected with  Melissa Blackwell on 02/20/23 by a audio enabled telemedicine application and verified that I am speaking with the correct person using two identifiers.  Patient Location: Home  Provider Location: Office/Clinic  I discussed the limitations of evaluation and management by telemedicine. The patient expressed understanding and agreed to proceed.  Subjective:   Melissa Blackwell is a 77 y.o. female who presents for Medicare Annual (Subsequent) preventive examination.  Review of Systems     Cardiac Risk Factors include: dyslipidemia     Objective:    Today's Vitals   02/20/23 1527  Weight: 130 lb (59 kg)  Height: '5\' 3"'$  (1.6 m)   Body mass index is 23.03 kg/m.     02/20/2023    3:35 PM 12/18/2018   12:27 PM 11/12/2016   11:16 AM  Advanced Directives  Does Patient Have a Medical Advance Directive? Yes Yes Yes  Type of Paramedic of [place];Living will [place];Living will [place];Living will  Copy of [place] in Chart? No - copy requested No - copy requested No - copy requested    Current Medications (verified) Outpatient Encounter Medications as of 02/20/2023  Medication Sig   Biotin 10000 MCG TABS Take by mouth.   Chlorophyll 100 MG TABS Take by mouth.   Cholecalciferol (VITAMIN D3) 5000 UNITS CAPS Take 5,000 Units by mouth daily.   KRILL OIL PO Take by mouth.   Multiple Vitamin (MULTIVITAMIN) capsule Take 1 capsule by mouth daily.   OVER THE COUNTER MEDICATION Take 2 tablets by mouth daily. Bone Restore   Probiotic Product (PROBIOTIC PO) Take 1 capsule by mouth daily.   RESVERATROL PO Take by mouth.   Turmeric (CURCUMIN 95 PO) Take by mouth.   aspirin 81 MG chewable tablet Chew by mouth daily.   diphenhydrAMINE (BENADRYL) 25 MG tablet Take 25 mg by mouth at bedtime.   ezetimibe (ZETIA) 10 MG tablet Take 1 tablet (10 mg total) by mouth daily.   MELATONIN-MAGNESIUM CITRATE  PO Take by mouth. (Patient not taking: Reported on 02/20/2023)   OVER THE COUNTER MEDICATION Take 2 tablets by mouth in the morning and at bedtime. Cholest Solve 24/7   No facility-administered encounter medications on file as of 02/20/2023.    Allergies (verified) Patient has no known allergies.   History: Past Medical History:  Diagnosis Date   Allergy    Cancer ([place])    skin CA Hx of squamous cell in situ   Diverticulosis of sigmoid colon    mild   GI bleed    Hx of adenomatous colonic polyps    Hyperlipidemia    Internal hemorrhoids    Lyme disease 06/2007   Dr Knox Royalty   Osteopenia    Renal cyst    right renal cyst   Thyroid nodule    Past Surgical History:  Procedure Laterality Date   ABDOMINAL HYSTERECTOMY  03/1996   partial fibroids   COLONOSCOPY     hemorrhoid banding     HEMORROIDECTOMY  04/2001   RHINOPLASTY     scar removal     keloid  scar   WRIST FRACTURE SURGERY     hardware  placed  and removed    Family History  Problem Relation Age of Onset   Cancer Mother        pancreatic CA   Heart disease Mother    Pancreatic cancer Mother    Heart disease Father  CHF and heart problems   Hyperlipidemia Sister    Cancer Sister        uterine CA   Uterine cancer Sister    Heart disease Maternal Grandmother    Colon polyps Daughter    Colon cancer Neg Hx    Esophageal cancer Neg Hx    Stomach cancer Neg Hx    Rectal cancer Neg Hx    Social History   Socioeconomic History   Marital status: Married    Spouse name: Not on file   Number of children: Not on file   Years of education: Not on file   Highest education level: Not on file  Occupational History   Not on file  Tobacco Use   Smoking status: Never   Smokeless tobacco: Never  Vaping Use   Vaping Use: Never used  Substance and Sexual Activity   Alcohol use: Yes    Alcohol/week: 0.0 standard drinks of alcohol    Comment: rarely - twice a year   Drug use: No   Sexual activity: Not on  file  Other Topics Concern   Not on file  Social History Narrative   Not on file   Social Determinants of Health   Financial Resource Strain: Low Risk  (02/20/2023)   Overall Financial Resource Strain (CARDIA)    Difficulty of Paying Living Expenses: Not hard at all  Food Insecurity: No Food Insecurity (02/20/2023)   Hunger Vital Sign    Worried About Running Out of Food in the Last Year: Never true    Ran Out of Food in the Last Year: Never true  Transportation Needs: No Transportation Needs (02/20/2023)   PRAPARE - Hydrologist (Medical): No    Lack of Transportation (Non-Medical): No  Physical Activity: Sufficiently Active (02/20/2023)   Exercise Vital Sign    Days of Exercise per Week: 4 days    Minutes of Exercise per Session: 120 min  Stress: No Stress Concern Present (02/20/2023)   [place]    Feeling of Stress : Not at all  Social Connections: Moderately Integrated (02/20/2023)   Social Connection and Isolation Panel [NHANES]    Frequency of Communication with Friends and Family: More than three times a week    Frequency of Social Gatherings with Friends and Family: More than three times a week    Attends Religious Services: More than 4 times per year    Active Member of [employer] or Organizations: No    Attends Music therapist: Never    Marital Status: Married    Tobacco Counseling Counseling given: Not Answered   Clinical Intake:  Pre-visit preparation completed: Yes  Pain : No/denies pain     Nutritional Risks: None Diabetes: No  How often do you need to have someone help you when you read instructions, pamphlets, or other written materials from your doctor or pharmacy?: 1 - Never  Diabetic? no  Interpreter Needed?: No  Information entered by :: [place] of Daily Living    02/20/2023    3:36 PM  In your present state of  health, do you have any difficulty performing the following activities:  Hearing? 0  Vision? 0  Difficulty concentrating or making decisions? 0  Walking or climbing stairs? 0  Dressing or bathing? 0  Doing errands, shopping? 0  Preparing Food and eating ? N  Using the Toilet? N  In the past six  months, have you accidently leaked urine? N  Do you have problems with loss of bowel control? N  Managing your Medications? N  Managing your Finances? N  Housekeeping or managing your Housekeeping? N    Patient Care Team: [place], Wynelle Fanny, MD as PCP - General  Indicate any recent Medical Services you may have received from other than [facility] providers in the past year (date may be approximate).     Assessment:   This is a routine wellness examination for Melissa Blackwell.  Hearing/Vision screen Hearing Screening - Comments::  aids Vision Screening - Comments:: No glasses  Dietary issues and exercise activities discussed: Current Exercise Habits: Home exercise routine, Type of exercise: Other - see comments (pickleball /gym), Time (Minutes): > 60, Frequency (Times/Week): 4, Weekly Exercise (Minutes/Week): 0, Intensity: Moderate, Exercise limited by: None identified   Goals Addressed             This Visit's Progress    Patient Stated       Would like to just maintain.       Depression Screen    02/20/2023    3:34 PM 02/19/2022    2:34 PM 01/15/2021    3:24 PM 01/04/2020    3:07 PM 12/18/2018   12:13 PM 12/10/2017   12:25 PM 11/12/2016   11:16 AM  PHQ 2/9 Scores  PHQ - 2 Score 0 0 0 0 0 0 0  PHQ- 9 Score     0      Fall Risk    02/20/2023    3:36 PM 02/19/2022    2:34 PM 01/15/2021    3:24 PM 01/04/2020    3:07 PM 11/17/2019    5:01 PM  [place] in the past year? 0 0 0 0 0  Comment     Emmi Telephone Survey: data to providers prior to load  Number falls in past yr: 0      Injury with Fall? 0      Risk for fall due to : No Fall Risks      Follow up Falls prevention  discussed;Falls evaluation completed Falls evaluation completed Falls evaluation completed Falls evaluation completed     FALL RISK PREVENTION PERTAINING TO THE HOME:  Any stairs in or around the home? Yes  If so, are there any without handrails? No  Home free of loose throw rugs in walkways, pet beds, electrical cords, etc? Yes  Adequate lighting in your home to reduce risk of falls? Yes   ASSISTIVE DEVICES UTILIZED TO PREVENT FALLS:  Life alert? No  Use of a cane, walker or w/c? No  Grab bars in the bathroom? Yes  Shower chair or bench in shower? Yes  Elevated toilet seat or a handicapped toilet? Yes    Cognitive Function:    12/18/2018   12:27 PM 11/12/2016   11:25 AM  MMSE - Mini Mental State Exam  Orientation to time 5 5  Orientation to Place 5 5  Registration 3 3  Attention/ Calculation 0 0  Recall 2 3  Recall-comments unable to recall 1 of 3 words   Language- name 2 objects 0 0  Language- repeat 1 1  Language- follow 3 step command 3 3  Language- read & follow direction 0 0  Write a sentence 0 0  Copy design 0 0  Total score 19 20        02/20/2023    3:38 PM  6CIT Screen  What Year? 0 points  What month? 0 points  What time? 0 points  Count back from 20 0 points  Months in reverse 0 points  Repeat phrase 0 points  Total Score 0 points    Immunizations Immunization History  Administered Date(s) Administered   Moderna Covid-19 Vaccine Bivalent Booster 54yr & up 01/04/2022   Moderna Sars-Covid-2 Vaccination 01/13/2020, 02/16/2020, 10/22/2020, 07/10/2021   Td 03/29/2003, 10/27/2013   Zoster Recombinat (Shingrix) 01/12/2019, 05/25/2019   Zoster, Live 11/29/2015    TDAP status: Due, Education has been provided regarding the importance of this vaccine. Advised may receive this vaccine at local pharmacy or Health Dept. Aware to provide a copy of the vaccination record if obtained from local pharmacy or Health Dept. Verbalized acceptance and  understanding.  Flu Vaccine status: Declined, Education has been provided regarding the importance of this vaccine but patient still declined. Advised may receive this vaccine at local pharmacy or Health Dept. Aware to provide a copy of the vaccination record if obtained from local pharmacy or Health Dept. Verbalized acceptance and understanding.  Pneumococcal vaccine status: Declined,  Education has been provided regarding the importance of this vaccine but patient still declined. Advised may receive this vaccine at local pharmacy or Health Dept. Aware to provide a copy of the vaccination record if obtained from local pharmacy or Health Dept. Verbalized acceptance and understanding.   Covid-19 vaccine status: Information provided on how to obtain vaccines.   Qualifies for Shingles Vaccine? No   Zostavax completed Yes   Shingrix Completed?: Yes  Screening Tests Health Maintenance  Topic Date Due   Pneumonia Vaccine 77 Years old (1 of 1 - PCV) Never done   INFLUENZA VACCINE  07/23/2022   COVID-19 Vaccine (6 - 2023-24 season) 08/23/2022   DTaP/Tdap/Td (3 - Tdap) 10/28/2023   COLONOSCOPY (Pts 45-44yrInsurance coverage will need to be confirmed)  11/24/2023   MAMMOGRAM  01/28/2024   Medicare Annual Wellness (AWV)  02/20/2024   DEXA SCAN  Completed   Hepatitis C Screening  Completed   Zoster Vaccines- Shingrix  Completed   HPV VACCINES  Aged Out    Health Maintenance  Health Maintenance Due  Topic Date Due   Pneumonia Vaccine [age]Years old (1 of 1 - PCV) Never done   INFLUENZA VACCINE  07/23/2022   COVID-19 Vaccine (6 - 2023-24 season) 08/23/2022    Colorectal cancer screening: Type of screening: Colonoscopy. Completed 11/23/20. Repeat every 3 years pt declined  Mammogram status: Completed 01/27/2023. Repeat every year  Bone Density status: Completed 06/07/2021. Results reflect: Bone density results: OSTEOPENIA. Repeat every 2 years. Ordered today.  Lung Cancer Screening:  (Low Dose CT Chest recommended if Age 54-80ears, 30 pack-year currently smoking OR have quit w/in 15years.) does not qualify.   Lung Cancer Screening Referral: no  Additional Screening:  Hepatitis C Screening: does not qualify; Completed 11/12/2016  Vision Screening: Recommended annual ophthalmology exams for early detection of glaucoma and other disorders of the eye. Is the patient up to date with their annual eye exam?  No , has appointment scheduled. Who is the provider or what is the name of the office in which the patient attends annual [facility] exams? [facility] If pt is not established with a provider, would they like to be referred to a provider to establish care? No .   Dental Screening: Recommended annual dental exams for proper oral hygiene  Community Resource Referral / Chronic Care Management: CRR required this visit?  No   CCM required this  visit?  No      Plan:     I have personally reviewed and noted the following in the patient's chart:   Medical and social history Use of alcohol, tobacco or illicit drugs  Current medications and supplements including opioid prescriptions. Patient is not currently taking opioid prescriptions. Functional ability and status Nutritional status Physical activity Advanced directives List of other physicians Hospitalizations, surgeries, and ER visits in previous 12 months Vitals Screenings to include cognitive, depression, and falls Referrals and appointments  In addition, I have reviewed and discussed with patient certain preventive protocols, quality metrics, and best practice recommendations. A written personalized care plan for preventive services as well as general preventive health recommendations were provided to patient.     Lebron Conners, LPN   [id number]   Nurse Notes: Vaccinations: declines all Influenza vaccine: recommend every Fall Pneumococcal vaccine: recommend once per lifetime Prevnar-20 Tdap  vaccine: recommend every 10 years Pt declines colonoscopy at this time.

## 2023-02-20 NOTE — Patient Instructions (Addendum)
[name] , Thank you for taking time to come for your Medicare Wellness Visit. I appreciate your ongoing commitment to your health goals. Please review the following plan we discussed and let me know if I can assist you in the future.   These are the goals we discussed:  Goals      Increase physical activity     Starting 12/18/2018, I will continue to exercise at least 50 min 2-3 days per week.      Patient Stated     Would like to just maintain.        This is a list of the screening recommended for you and due dates:  Health Maintenance  Topic Date Due   Pneumonia Vaccine (1 of 1 - PCV) Never done   Flu Shot  07/23/2022   COVID-19 Vaccine (6 - 2023-24 season) 08/23/2022   DTaP/Tdap/Td vaccine (3 - Tdap) 10/28/2023   Colon Cancer Screening  11/24/2023   Mammogram  01/28/2024   Medicare Annual Wellness Visit  02/20/2024   DEXA scan (bone density measurement)  Completed   Hepatitis C Screening: USPSTF Recommendation to screen - Ages 55-79 yo.  Completed   Zoster (Shingles) Vaccine  Completed   HPV Vaccine  Aged Out    Advanced directives: Please bring a copy of your health care power of attorney and living will to the office to be added to your chart at your convenience.   Conditions/risks identified: none  Next appointment: Follow up in one year for your annual wellness visit 02/26/2024 @ 10:45am via telephone   Preventive Care 65 Years and Older, Female Preventive care refers to lifestyle choices and visits with your health care provider that can promote health and wellness. What does preventive care include? A yearly physical exam. This is also called an annual well check. Dental exams once or twice a year. Routine eye exams. Ask your health care provider how often you should have your eyes checked. Personal lifestyle choices, including: Daily care of your teeth and gums. Regular physical activity. Eating a healthy diet. Avoiding tobacco and drug use. Limiting  alcohol use. Practicing safe sex. Taking low-dose aspirin every day. Taking vitamin and mineral supplements as recommended by your health care provider. What happens during an annual well check? The services and screenings done by your health care provider during your annual well check will depend on your age, overall health, lifestyle risk factors, and family history of disease. Counseling  Your health care provider may ask you questions about your: Alcohol use. Tobacco use. Drug use. Emotional well-being. Home and relationship well-being. Sexual activity. Eating habits. History of falls. Memory and ability to understand (cognition). Work and work Statistician. Reproductive health. Screening  You may have the following tests or measurements: Height, weight, and BMI. Blood pressure. Lipid and cholesterol levels. These may be checked every 5 years, or more frequently if you are over 79 years old. Skin check. Lung cancer screening. You may have this screening every year starting at age 5 if you have a 30-pack-year history of smoking and currently smoke or have quit within the past 15 years. Fecal occult blood test (FOBT) of the stool. You may have this test every year starting at age 62. Flexible sigmoidoscopy or colonoscopy. You may have a sigmoidoscopy every 5 years or a colonoscopy every 10 years starting at age 55. Hepatitis C blood test. Hepatitis B blood test. Sexually transmitted disease (STD) testing. Diabetes screening. This is done by checking your blood sugar (  glucose) after you have not eaten for a while (fasting). You may have this done every 1-3 years. Bone density scan. This is done to screen for osteoporosis. You may have this done starting at age 54. Mammogram. This may be done every 1-2 years. Talk to your health care provider about how often you should have regular mammograms. Talk with your health care provider about your test results, treatment options, and if  necessary, the need for more tests. Vaccines  Your health care provider may recommend certain vaccines, such as: Influenza vaccine. This is recommended every year. Tetanus, diphtheria, and acellular pertussis (Tdap, Td) vaccine. You may need a Td booster every 10 years. Zoster vaccine. You may need this after age 17. Pneumococcal 13-valent conjugate (PCV13) vaccine. One dose is recommended after age 67. Pneumococcal polysaccharide (PPSV23) vaccine. One dose is recommended after age 4. Talk to your health care provider about which screenings and vaccines you need and how often you need them. This information is not intended to replace advice given to you by your health care provider. Make sure you discuss any questions you have with your health care provider. Document Released: 01/05/2016 Document Revised: 08/28/2016 Document Reviewed: 10/10/2015 Elsevier Interactive Patient Education  2017 [employer] Prevention in the Home Falls can cause injuries. They can happen to people of all ages. There are many things you can do to make your home safe and to help prevent falls. What can I do on the outside of my home? Regularly fix the edges of walkways and driveways and fix any cracks. Remove anything that might make you trip as you walk through a door, such as a raised step or threshold. Trim any bushes or trees on the path to your home. Use bright outdoor lighting. Clear any walking paths of anything that might make someone trip, such as rocks or tools. Regularly check to see if handrails are loose or broken. Make sure that both sides of any steps have handrails. Any raised decks and porches should have guardrails on the edges. Have any leaves, snow, or ice cleared regularly. Use sand or salt on walking paths during winter. Clean up any spills in your garage right away. This includes oil or grease spills. What can I do in the bathroom? Use night lights. Install grab bars by the toilet  and in the tub and shower. Do not use towel bars as grab bars. Use non-skid mats or decals in the tub or shower. If you need to sit down in the shower, use a plastic, non-slip stool. Keep the floor dry. Clean up any water that spills on the floor as soon as it happens. Remove soap buildup in the tub or shower regularly. Attach bath mats securely with double-sided non-slip rug tape. Do not have throw rugs and other things on the floor that can make you trip. What can I do in the bedroom? Use night lights. Make sure that you have a light by your bed that is easy to reach. Do not use any sheets or blankets that are too big for your bed. They should not hang down onto the floor. Have a firm chair that has side arms. You can use this for support while you get dressed. Do not have throw rugs and other things on the floor that can make you trip. What can I do in the kitchen? Clean up any spills right away. Avoid walking on wet floors. Keep items that you use a lot in easy-to-reach places.  If you need to reach something above you, use a strong step stool that has a grab bar. Keep electrical cords out of the way. Do not use floor polish or wax that makes floors slippery. If you must use wax, use non-skid floor wax. Do not have throw rugs and other things on the floor that can make you trip. What can I do with my stairs? Do not leave any items on the stairs. Make sure that there are handrails on both sides of the stairs and use them. Fix handrails that are broken or loose. Make sure that handrails are as long as the stairways. Check any carpeting to make sure that it is firmly attached to the stairs. Fix any carpet that is loose or worn. Avoid having throw rugs at the top or bottom of the stairs. If you do have throw rugs, attach them to the floor with carpet tape. Make sure that you have a light switch at the top of the stairs and the bottom of the stairs. If you do not have them, ask someone to add  them for you. What else can I do to help prevent falls? Wear shoes that: Do not have high heels. Have rubber bottoms. Are comfortable and fit you well. Are closed at the toe. Do not wear sandals. If you use a stepladder: Make sure that it is fully opened. Do not climb a closed stepladder. Make sure that both sides of the stepladder are locked into place. Ask someone to hold it for you, if possible. Clearly mark and make sure that you can see: Any grab bars or handrails. First and last steps. Where the edge of each step is. Use tools that help you move around (mobility aids) if they are needed. These include: Canes. Walkers. Scooters. Crutches. Turn on the lights when you go into a dark area. Replace any light bulbs as soon as they burn out. Set up your furniture so you have a clear path. Avoid moving your furniture around. If any of your floors are uneven, fix them. If there are any pets around you, be aware of where they are. Review your medicines with your doctor. Some medicines can make you feel dizzy. This can increase your chance of falling. Ask your doctor what other things that you can do to help prevent falls. This information is not intended to replace advice given to you by your health care provider. Make sure you discuss any questions you have with your health care provider. Document Released: 10/05/2009 Document Revised: 05/16/2016 Document Reviewed: 01/13/2015 Elsevier Interactive Patient Education  2017 [employer].

## 2023-02-25 ENCOUNTER — Ambulatory Visit (INDEPENDENT_AMBULATORY_CARE_PROVIDER_SITE_OTHER): Payer: Medicare Other | Admitting: Family Medicine

## 2023-02-25 ENCOUNTER — Encounter: Payer: Self-pay | Admitting: Family Medicine

## 2023-02-25 VITALS — BP 124/74 | HR 68 | Temp 97.6°F | Ht 63.0 in | Wt 135.0 lb

## 2023-02-25 DIAGNOSIS — Z860100 Personal history of colon polyps, unspecified: Secondary | ICD-10-CM

## 2023-02-25 DIAGNOSIS — E7800 Pure hypercholesterolemia, unspecified: Secondary | ICD-10-CM | POA: Diagnosis not present

## 2023-02-25 DIAGNOSIS — Z85828 Personal history of other malignant neoplasm of skin: Secondary | ICD-10-CM | POA: Diagnosis not present

## 2023-02-25 DIAGNOSIS — E559 Vitamin D deficiency, unspecified: Secondary | ICD-10-CM | POA: Diagnosis not present

## 2023-02-25 DIAGNOSIS — E041 Nontoxic single thyroid nodule: Secondary | ICD-10-CM | POA: Diagnosis not present

## 2023-02-25 DIAGNOSIS — M8589 Other specified disorders of bone density and structure, multiple sites: Secondary | ICD-10-CM | POA: Diagnosis not present

## 2023-02-25 NOTE — Assessment & Plan Note (Signed)
In past Lab Results  Component Value Date   TSH 2.17 02/18/2023

## 2023-02-25 NOTE — Assessment & Plan Note (Signed)
Colonoscopy 11/2020 with 3-5 y recall for polyps

## 2023-02-25 NOTE — Assessment & Plan Note (Signed)
Vitamin D level is therapeutic with current supplementation Disc importance of this to bone and overall health Good at 58.7

## 2023-02-25 NOTE — Assessment & Plan Note (Signed)
Wears sun protection Sees derm 1-2 times per year

## 2023-02-25 NOTE — Assessment & Plan Note (Signed)
Disc goals for lipids and reasons to control them Rev last labs with pt Rev low sat fat diet in detail  Intol of zetia Declines statin  Diet is improved but could improve more  ASCVD risk score is est at 19.3%  Disc option of coronary ca scan-will read about it and let us know if interested in that

## 2023-02-25 NOTE — Assessment & Plan Note (Signed)
Dexa 05/2021 stable osteopenia No falls or fractures  Taking vitamin D and level is stable Balanced diet  Good exercise incl gym   Will continue to follow

## 2023-02-25 NOTE — Progress Notes (Signed)
Subjective:    Patient ID: Melissa Blackwell, female    DOB: [birth year], 77 y.o.   MRN: [id number]  HPI Here for annual f/u of chronic health problems   Wt Readings from Last 3 Encounters:  02/25/23 135 lb (61.2 kg)  02/20/23 130 lb (59 kg)  02/19/22 137 lb 2 oz (62.2 kg)   23.91 kg/m  Playing pickleball Taking care of herself     Vitals:   02/25/23 1349  BP: 124/74  Pulse: 68  Temp: 97.6 F (36.4 C)  SpO2: 98%   Goes to gym 3 d per week    Immunization History  Administered Date(s) Administered   Moderna Covid-19 Vaccine Bivalent Booster 29yr & up 01/04/2022   Moderna Sars-Covid-2 Vaccination 01/13/2020, 02/16/2020, 10/22/2020, 07/10/2021   Td 03/29/2003, 10/27/2013   Zoster Recombinat (Shingrix) 01/12/2019, 05/25/2019   Zoster, Live 11/29/2015   Health Maintenance Due  Topic Date Due   Pneumonia Vaccine 77 Years old (1 of 1 - PCV) Never done   INFLUENZA VACCINE  07/23/2022   COVID-19 Vaccine (6 - 2023-24 season) 08/23/2022   Imms: declines all this year   Colonoscopy 11/2020 with 3 -5 y recall for polyps  H/o chronic constipation    Mammogram 01/2023 Self breast exam: no lumps   Dexa  05/2021  osteopenia Next one ordered -due in June so told her to call and schedule in May  Falls- none  Fractures-none  Supplements vit D  D level of 58.7 Exercise - pickleball   Hyperlipidemia Lab Results  Component Value Date   CHOL 252 (H) 02/18/2023   CHOL 201 (H) 04/23/2022   CHOL 270 (H) 02/12/2022   Lab Results  Component Value Date   HDL 76.80 02/18/2023   HDL 78.80 04/23/2022   HDL 75.50 02/12/2022   Lab Results  Component Value Date   LDLCALC 156 (H) 02/18/2023   LDLCALC 110 (H) 04/23/2022   LDLCALC 175 (H) 02/12/2022   Lab Results  Component Value Date   TRIG 96.0 02/18/2023   TRIG 62.0 04/23/2022   TRIG 100.0 02/12/2022   Lab Results  Component Value Date   CHOLHDL 3 02/18/2023   CHOLHDL 3 04/23/2022   CHOLHDL 4 02/12/2022   Lab  Results  Component Value Date   LDLDIRECT 124.9 10/14/2013   LDLDIRECT 148.5 10/07/2012   LDLDIRECT 129.0 10/10/2011   LDL of 156 Declines medication incl statin  Tried zetia -and it caused her to have thumb and hand pain / got better off of it   Diet : eats one strip of bacon and one egg    The 10-year ASCVD risk score (Arnett DK, et al., 2019) is: 19.3%   Values used to calculate the score:     Age: [age]years     Sex: Female     Is Non-Hispanic African American: No     Diabetic: No     Tobacco smoker: No     Systolic Blood Pressure: 1A999333mmHg     Is BP treated: No     HDL Cholesterol: 76.8 mg/dL     Total Cholesterol: 252 mg/dL     Other labs Lab Results  Component Value Date   CREATININE 0.73 02/18/2023   BUN 16 02/18/2023   NA 140 02/18/2023   K 4.6 02/18/2023   CL 104 02/18/2023   CO2 30 02/18/2023   Lab Results  Component Value Date   ALT 16 02/18/2023   AST 19 02/18/2023   ALKPHOS 89  02/18/2023   BILITOT 0.5 02/18/2023   Lab Results  Component Value Date   WBC 4.6 02/18/2023   HGB 13.4 02/18/2023   HCT 40.2 02/18/2023   MCV 87.5 02/18/2023   PLT 194.0 02/18/2023   Lab Results  Component Value Date   TSH 2.17 02/18/2023    Patient Active Problem List   Diagnosis Date Noted   Thyroid nodule 01/04/2020   Estrogen deficiency 11/20/2016   Vitamin D deficiency 10/31/2014   Hyperlipidemia 10/31/2014   Encounter for Medicare annual wellness exam 10/13/2013   DIVERTICULOSIS, COLON 08/23/2010   COLONIC POLYPS, ADENOMATOUS, HX OF 08/23/2010   History of Lyme disease 08/10/2007   Hemorrhoids 07/21/2007   CONSTIPATION, CHRONIC 07/21/2007   Osteopenia 07/21/2007   RHEUMATOID FACTOR, POSITIVE 07/21/2007   SKIN CANCER, HX OF 07/21/2007   Past Medical History:  Diagnosis Date   Allergy    Cancer ([place])    skin CA Hx of squamous cell in situ   Diverticulosis of sigmoid colon    mild   GI bleed    Hx of adenomatous colonic polyps    Hyperlipidemia     Internal hemorrhoids    Lyme disease 06/2007   Dr Knox Royalty   Osteopenia    Renal cyst    right renal cyst   Thyroid nodule    Past Surgical History:  Procedure Laterality Date   ABDOMINAL HYSTERECTOMY  03/1996   partial fibroids   COLONOSCOPY     hemorrhoid banding     HEMORROIDECTOMY  04/2001   RHINOPLASTY     scar removal     keloid  scar   WRIST FRACTURE SURGERY     hardware  placed  and removed    Social History   Tobacco Use   Smoking status: Never   Smokeless tobacco: Never  Vaping Use   Vaping Use: Never used  Substance Use Topics   Alcohol use: Yes    Alcohol/week: 0.0 standard drinks of alcohol    Comment: rarely - twice a year   Drug use: No   Family History  Problem Relation Age of Onset   Cancer Mother        pancreatic CA   Heart disease Mother    Pancreatic cancer Mother    Heart disease Father        CHF and heart problems   Hyperlipidemia Sister    Cancer Sister        uterine CA   Uterine cancer Sister    Heart disease Maternal Grandmother    Colon polyps Daughter    Colon cancer Neg Hx    Esophageal cancer Neg Hx    Stomach cancer Neg Hx    Rectal cancer Neg Hx    Allergies  Allergen Reactions   Zetia [Ezetimibe]     Hand pain    Current Outpatient Medications on File Prior to Visit  Medication Sig Dispense Refill   Biotin 10000 MCG TABS Take by mouth.     Chlorophyll 100 MG TABS Take by mouth.     Cholecalciferol (VITAMIN D3) 5000 UNITS CAPS Take 5,000 Units by mouth daily.     KRILL OIL PO Take by mouth.     MELATONIN-MAGNESIUM CITRATE PO Take by mouth.     Multiple Vitamin (MULTIVITAMIN) capsule Take 1 capsule by mouth daily.     OVER THE COUNTER MEDICATION Take 2 tablets by mouth daily. Bone Restore     Probiotic Product (PROBIOTIC PO) Take 1 capsule by mouth  daily.     RESVERATROL PO Take by mouth.     Turmeric (CURCUMIN 95 PO) Take by mouth.     No current facility-administered medications on file prior to visit.      Review of Systems  Constitutional:  Negative for activity change, appetite change, fatigue, fever and unexpected weight change.  HENT:  Negative for congestion, ear pain, rhinorrhea, sinus pressure and sore throat.   Eyes:  Negative for pain, redness and visual disturbance.  Respiratory:  Negative for cough, shortness of breath and wheezing.   Cardiovascular:  Negative for chest pain and palpitations.  Gastrointestinal:  Negative for abdominal pain, blood in stool, constipation and diarrhea.  Endocrine: Negative for polydipsia and polyuria.  Genitourinary:  Negative for dysuria, frequency and urgency.  Musculoskeletal:  Negative for arthralgias, back pain and myalgias.       Hand pain is better off zetia   Skin:  Negative for pallor and rash.  Allergic/Immunologic: Negative for environmental allergies.  Neurological:  Negative for dizziness, syncope and headaches.  Hematological:  Negative for adenopathy. Does not bruise/bleed easily.  Psychiatric/Behavioral:  Negative for decreased concentration and dysphoric mood. The patient is not nervous/anxious.        Objective:   Physical Exam Constitutional:      General: She is not in acute distress.    Appearance: Normal appearance. She is well-developed and normal weight. She is not ill-appearing or diaphoretic.  HENT:     Head: Normocephalic and atraumatic.     Right Ear: Tympanic membrane, ear canal and external ear normal.     Left Ear: Tympanic membrane, ear canal and external ear normal.     Nose: Nose normal. No congestion.     Mouth/Throat:     Mouth: Mucous membranes are moist.     Pharynx: Oropharynx is clear. No posterior oropharyngeal erythema.  Eyes:     General: No scleral icterus.    Extraocular Movements: Extraocular movements intact.     Conjunctiva/sclera: Conjunctivae normal.     Pupils: Pupils are equal, round, and reactive to light.  Neck:     Thyroid: No thyromegaly.     Vascular: No carotid bruit or JVD.   Cardiovascular:     Rate and Rhythm: Normal rate and regular rhythm.     Pulses: Normal pulses.     Heart sounds: Normal heart sounds.     No gallop.  Pulmonary:     Effort: Pulmonary effort is normal. No respiratory distress.     Breath sounds: Normal breath sounds. No wheezing.     Comments: Good air exch Chest:     Chest wall: No tenderness.  Abdominal:     General: Bowel sounds are normal. There is no distension or abdominal bruit.     Palpations: Abdomen is soft. There is no mass.     Tenderness: There is no abdominal tenderness.     Hernia: No hernia is present.  Genitourinary:    Comments: Breast exam: No mass, nodules, thickening, tenderness, bulging, retraction, inflamation, nipple discharge or skin changes noted.  No axillary or clavicular LA.     Musculoskeletal:        General: No tenderness. Normal range of motion.     Cervical back: Normal range of motion and neck supple. No rigidity. No muscular tenderness.     Right lower leg: No edema.     Left lower leg: No edema.     Comments: No kyphosis   Lymphadenopathy:  Cervical: No cervical adenopathy.  Skin:    General: Skin is warm and dry.     Coloration: Skin is not pale.     Findings: No erythema or rash.     Comments: Solar lentigines diffusely Scattered SKs   Neurological:     Mental Status: She is alert. Mental status is at baseline.     Cranial Nerves: No cranial nerve deficit.     Motor: No abnormal muscle tone.     Coordination: Coordination normal.     Gait: Gait normal.     Deep Tendon Reflexes: Reflexes are normal and symmetric. Reflexes normal.  Psychiatric:        Mood and Affect: Mood normal.        Cognition and Memory: Cognition and memory normal.           Assessment & Plan:   Problem List Items Addressed This Visit       Endocrine   Thyroid nodule    In past Lab Results  Component Value Date   TSH 2.17 02/18/2023          Musculoskeletal and Integument   Osteopenia     Dexa 05/2021 stable osteopenia No falls or fractures  Taking vitamin D and level is stable Balanced diet  Good exercise incl gym   Will continue to follow         Other   COLONIC POLYPS, ADENOMATOUS, HX OF    Colonoscopy 11/2020 with 3-5 y recall for polyps       Hyperlipidemia - Primary    Disc goals for lipids and reasons to control them Rev last labs with pt Rev low sat fat diet in detail  Intol of zetia Declines statin  Diet is improved but could improve more  ASCVD risk score is est at 19.3%  Disc option of coronary ca scan-will read about it and let us know if interested in that           SKIN CANCER, HX OF    Wears sun protection Sees derm 1-2 times per year       Vitamin D deficiency    Vitamin D level is therapeutic with current supplementation Disc importance of this to bone and overall health Good at 58.7

## 2023-02-25 NOTE — Patient Instructions (Addendum)
Take a look at the info on coronary calcium scan Let us know if you want to do it   For cholesterol  Avoid red meat/ fried foods/ egg yolks/ fatty breakfast meats/ butter, cheese and high fat dairy/ and shellfish   Keep taking good care of yourself

## 2023-03-13 DIAGNOSIS — E041 Nontoxic single thyroid nodule: Secondary | ICD-10-CM | POA: Diagnosis not present

## 2023-03-27 DIAGNOSIS — S33140A Subluxation of L4/L5 lumbar vertebra, initial encounter: Secondary | ICD-10-CM | POA: Diagnosis not present

## 2023-03-27 DIAGNOSIS — M9901 Segmental and somatic dysfunction of cervical region: Secondary | ICD-10-CM | POA: Diagnosis not present

## 2023-03-27 DIAGNOSIS — S13160A Subluxation of C5/C6 cervical vertebrae, initial encounter: Secondary | ICD-10-CM | POA: Diagnosis not present

## 2023-03-27 DIAGNOSIS — M9903 Segmental and somatic dysfunction of lumbar region: Secondary | ICD-10-CM | POA: Diagnosis not present

## 2023-03-31 DIAGNOSIS — L821 Other seborrheic keratosis: Secondary | ICD-10-CM | POA: Diagnosis not present

## 2023-03-31 DIAGNOSIS — L570 Actinic keratosis: Secondary | ICD-10-CM | POA: Diagnosis not present

## 2023-03-31 DIAGNOSIS — L905 Scar conditions and fibrosis of skin: Secondary | ICD-10-CM | POA: Diagnosis not present

## 2023-03-31 DIAGNOSIS — Z85828 Personal history of other malignant neoplasm of skin: Secondary | ICD-10-CM | POA: Diagnosis not present

## 2023-03-31 DIAGNOSIS — D1801 Hemangioma of skin and subcutaneous tissue: Secondary | ICD-10-CM | POA: Diagnosis not present

## 2023-03-31 DIAGNOSIS — L820 Inflamed seborrheic keratosis: Secondary | ICD-10-CM | POA: Diagnosis not present

## 2023-05-22 DIAGNOSIS — S33140A Subluxation of L4/L5 lumbar vertebra, initial encounter: Secondary | ICD-10-CM | POA: Diagnosis not present

## 2023-05-22 DIAGNOSIS — M9903 Segmental and somatic dysfunction of lumbar region: Secondary | ICD-10-CM | POA: Diagnosis not present

## 2023-06-05 DIAGNOSIS — S33140A Subluxation of L4/L5 lumbar vertebra, initial encounter: Secondary | ICD-10-CM | POA: Diagnosis not present

## 2023-06-05 DIAGNOSIS — M9903 Segmental and somatic dysfunction of lumbar region: Secondary | ICD-10-CM | POA: Diagnosis not present

## 2023-07-03 DIAGNOSIS — M9903 Segmental and somatic dysfunction of lumbar region: Secondary | ICD-10-CM | POA: Diagnosis not present

## 2023-07-03 DIAGNOSIS — S33140A Subluxation of L4/L5 lumbar vertebra, initial encounter: Secondary | ICD-10-CM | POA: Diagnosis not present

## 2023-07-07 DIAGNOSIS — H2513 Age-related nuclear cataract, bilateral: Secondary | ICD-10-CM | POA: Diagnosis not present

## 2023-07-07 DIAGNOSIS — H5203 Hypermetropia, bilateral: Secondary | ICD-10-CM | POA: Diagnosis not present

## 2023-07-07 DIAGNOSIS — H40013 Open angle with borderline findings, low risk, bilateral: Secondary | ICD-10-CM | POA: Diagnosis not present

## 2023-07-24 DIAGNOSIS — M9903 Segmental and somatic dysfunction of lumbar region: Secondary | ICD-10-CM | POA: Diagnosis not present

## 2023-07-24 DIAGNOSIS — S33140A Subluxation of L4/L5 lumbar vertebra, initial encounter: Secondary | ICD-10-CM | POA: Diagnosis not present

## 2023-08-20 DIAGNOSIS — M9901 Segmental and somatic dysfunction of cervical region: Secondary | ICD-10-CM | POA: Diagnosis not present

## 2023-08-20 DIAGNOSIS — S13160A Subluxation of C5/C6 cervical vertebrae, initial encounter: Secondary | ICD-10-CM | POA: Diagnosis not present

## 2023-08-20 DIAGNOSIS — S33140A Subluxation of L4/L5 lumbar vertebra, initial encounter: Secondary | ICD-10-CM | POA: Diagnosis not present

## 2023-08-20 DIAGNOSIS — M9903 Segmental and somatic dysfunction of lumbar region: Secondary | ICD-10-CM | POA: Diagnosis not present

## 2023-08-23 DIAGNOSIS — Z23 Encounter for immunization: Secondary | ICD-10-CM | POA: Diagnosis not present

## 2023-09-18 DIAGNOSIS — H6123 Impacted cerumen, bilateral: Secondary | ICD-10-CM | POA: Diagnosis not present

## 2023-09-18 DIAGNOSIS — H6093 Unspecified otitis externa, bilateral: Secondary | ICD-10-CM | POA: Diagnosis not present

## 2023-09-29 DIAGNOSIS — D1801 Hemangioma of skin and subcutaneous tissue: Secondary | ICD-10-CM | POA: Diagnosis not present

## 2023-09-29 DIAGNOSIS — Z85828 Personal history of other malignant neoplasm of skin: Secondary | ICD-10-CM | POA: Diagnosis not present

## 2023-09-29 DIAGNOSIS — L905 Scar conditions and fibrosis of skin: Secondary | ICD-10-CM | POA: Diagnosis not present

## 2023-09-29 DIAGNOSIS — L820 Inflamed seborrheic keratosis: Secondary | ICD-10-CM | POA: Diagnosis not present

## 2023-09-29 DIAGNOSIS — B009 Herpesviral infection, unspecified: Secondary | ICD-10-CM | POA: Diagnosis not present

## 2023-09-29 DIAGNOSIS — L821 Other seborrheic keratosis: Secondary | ICD-10-CM | POA: Diagnosis not present

## 2023-10-01 DIAGNOSIS — M9903 Segmental and somatic dysfunction of lumbar region: Secondary | ICD-10-CM | POA: Diagnosis not present

## 2023-10-01 DIAGNOSIS — S33140A Subluxation of L4/L5 lumbar vertebra, initial encounter: Secondary | ICD-10-CM | POA: Diagnosis not present

## 2023-11-12 DIAGNOSIS — M9903 Segmental and somatic dysfunction of lumbar region: Secondary | ICD-10-CM | POA: Diagnosis not present

## 2023-11-12 DIAGNOSIS — S33140A Subluxation of L4/L5 lumbar vertebra, initial encounter: Secondary | ICD-10-CM | POA: Diagnosis not present

## 2023-12-15 ENCOUNTER — Other Ambulatory Visit: Payer: Self-pay | Admitting: Family Medicine

## 2023-12-15 DIAGNOSIS — Z1231 Encounter for screening mammogram for malignant neoplasm of breast: Secondary | ICD-10-CM

## 2023-12-18 DIAGNOSIS — M9903 Segmental and somatic dysfunction of lumbar region: Secondary | ICD-10-CM | POA: Diagnosis not present

## 2023-12-18 DIAGNOSIS — S33140A Subluxation of L4/L5 lumbar vertebra, initial encounter: Secondary | ICD-10-CM | POA: Diagnosis not present

## 2024-01-01 DIAGNOSIS — H40013 Open angle with borderline findings, low risk, bilateral: Secondary | ICD-10-CM | POA: Diagnosis not present

## 2024-01-29 DIAGNOSIS — M9903 Segmental and somatic dysfunction of lumbar region: Secondary | ICD-10-CM | POA: Diagnosis not present

## 2024-01-29 DIAGNOSIS — S33140A Subluxation of L4/L5 lumbar vertebra, initial encounter: Secondary | ICD-10-CM | POA: Diagnosis not present

## 2024-02-03 ENCOUNTER — Ambulatory Visit: Payer: Medicare Other

## 2024-02-18 ENCOUNTER — Ambulatory Visit
Admission: RE | Admit: 2024-02-18 | Discharge: 2024-02-18 | Disposition: A | Discharge status: Home / Self Care | Payer: Medicare Other | Source: Ambulatory Visit | Attending: Family Medicine | Admitting: Family Medicine

## 2024-02-18 DIAGNOSIS — Z1231 Encounter for screening mammogram for malignant neoplasm of breast: Secondary | ICD-10-CM | POA: Diagnosis not present

## 2024-02-22 ENCOUNTER — Encounter: Payer: Self-pay | Admitting: Family Medicine

## 2024-02-23 ENCOUNTER — Telehealth: Payer: Self-pay | Admitting: Family Medicine

## 2024-02-23 DIAGNOSIS — K5909 Other constipation: Secondary | ICD-10-CM

## 2024-02-23 DIAGNOSIS — E7800 Pure hypercholesterolemia, unspecified: Secondary | ICD-10-CM

## 2024-02-23 DIAGNOSIS — E041 Nontoxic single thyroid nodule: Secondary | ICD-10-CM

## 2024-02-23 DIAGNOSIS — E559 Vitamin D deficiency, unspecified: Secondary | ICD-10-CM

## 2024-02-23 DIAGNOSIS — Z8619 Personal history of other infectious and parasitic diseases: Secondary | ICD-10-CM

## 2024-02-23 NOTE — Telephone Encounter (Signed)
-----   Message from Lovena Neighbours sent at 02/23/2024  2:00 PM EST ----- Regarding: Labs for Wednesday 3.5.25 Please put physical fasting lab orders in future. Thank you, Denny Peon

## 2024-02-24 ENCOUNTER — Other Ambulatory Visit (INDEPENDENT_AMBULATORY_CARE_PROVIDER_SITE_OTHER)

## 2024-02-24 DIAGNOSIS — E7800 Pure hypercholesterolemia, unspecified: Secondary | ICD-10-CM | POA: Diagnosis not present

## 2024-02-24 DIAGNOSIS — K5909 Other constipation: Secondary | ICD-10-CM | POA: Diagnosis not present

## 2024-02-24 DIAGNOSIS — E041 Nontoxic single thyroid nodule: Secondary | ICD-10-CM | POA: Diagnosis not present

## 2024-02-24 DIAGNOSIS — E559 Vitamin D deficiency, unspecified: Secondary | ICD-10-CM | POA: Diagnosis not present

## 2024-02-24 DIAGNOSIS — Z8619 Personal history of other infectious and parasitic diseases: Secondary | ICD-10-CM | POA: Diagnosis not present

## 2024-02-24 LAB — CBC WITH DIFFERENTIAL/PLATELET
Basophils Absolute: 0 10*3/uL (ref 0.0–0.1)
Basophils Relative: 0.9 % (ref 0.0–3.0)
Eosinophils Absolute: 0.1 10*3/uL (ref 0.0–0.7)
Eosinophils Relative: 1.4 % (ref 0.0–5.0)
HCT: 39.8 % (ref 36.0–46.0)
Hemoglobin: 13.2 g/dL (ref 12.0–15.0)
Lymphocytes Relative: 28.2 % (ref 12.0–46.0)
Lymphs Abs: 1.3 10*3/uL (ref 0.7–4.0)
MCHC: 33.2 g/dL (ref 30.0–36.0)
MCV: 88.2 fl (ref 78.0–100.0)
Monocytes Absolute: 0.4 10*3/uL (ref 0.1–1.0)
Monocytes Relative: 8.4 % (ref 3.0–12.0)
Neutro Abs: 2.7 10*3/uL (ref 1.4–7.7)
Neutrophils Relative %: 61.1 % (ref 43.0–77.0)
Platelets: 198 10*3/uL (ref 150.0–400.0)
RBC: 4.51 Mil/uL (ref 3.87–5.11)
RDW: 14.1 % (ref 11.5–15.5)
WBC: 4.4 10*3/uL (ref 4.0–10.5)

## 2024-02-24 LAB — TSH: TSH: 2.07 u[IU]/mL (ref 0.35–5.50)

## 2024-02-24 LAB — COMPREHENSIVE METABOLIC PANEL
ALT: 18 U/L (ref 0–35)
AST: 24 U/L (ref 0–37)
Albumin: 4.4 g/dL (ref 3.5–5.2)
Alkaline Phosphatase: 85 U/L (ref 39–117)
BUN: 15 mg/dL (ref 6–23)
CO2: 29 meq/L (ref 19–32)
Calcium: 9.5 mg/dL (ref 8.4–10.5)
Chloride: 106 meq/L (ref 96–112)
Creatinine, Ser: 0.7 mg/dL (ref 0.40–1.20)
GFR: 82.51 mL/min (ref >60.00)
Glucose, Bld: 93 mg/dL (ref 70–99)
Potassium: 4.3 meq/L (ref 3.5–5.1)
Sodium: 142 meq/L (ref 135–145)
Total Bilirubin: 0.5 mg/dL (ref 0.2–1.2)
Total Protein: 6.9 g/dL (ref 6.0–8.3)

## 2024-02-24 LAB — LIPID PANEL
Cholesterol: 262 mg/dL — ABNORMAL HIGH (ref 0–200)
HDL: 76 mg/dL (ref >39.00)
LDL Cholesterol: 167 mg/dL — ABNORMAL HIGH (ref 0–99)
NonHDL: 186.44
Total CHOL/HDL Ratio: 3
Triglycerides: 98 mg/dL (ref 0.0–149.0)
VLDL: 19.6 mg/dL (ref 0.0–40.0)

## 2024-02-24 LAB — VITAMIN D 25 HYDROXY (VIT D DEFICIENCY, FRACTURES): VITD: 65.76 ng/mL (ref 30.00–100.00)

## 2024-02-25 ENCOUNTER — Other Ambulatory Visit

## 2024-02-26 ENCOUNTER — Other Ambulatory Visit: Payer: Medicare Other

## 2024-02-26 ENCOUNTER — Ambulatory Visit: Payer: Medicare Other

## 2024-02-26 VITALS — Ht 63.0 in | Wt 130.0 lb

## 2024-02-26 DIAGNOSIS — Z0000 Encounter for general adult medical examination without abnormal findings: Secondary | ICD-10-CM | POA: Diagnosis not present

## 2024-02-26 NOTE — Patient Instructions (Signed)
 Melissa Blackwell , Thank you for taking time to come for your Medicare Wellness Visit. I appreciate your ongoing commitment to your health goals. Please review the following plan we discussed and let me know if I can assist you in the future.   Referrals/Orders/Follow-Ups/Clinician Recommendations: none  This is a list of the screening recommended for you and due dates:  Health Maintenance  Topic Date Due   Pneumonia Vaccine (1 of 1 - PCV) Never done   Flu Shot  07/24/2023   COVID-19 Vaccine (6 - 2024-25 season) 08/24/2023   DTaP/Tdap/Td vaccine (3 - Tdap) 10/28/2023   Colon Cancer Screening  11/24/2023   Mammogram  02/17/2025   Medicare Annual Wellness Visit  02/25/2025   DEXA scan (bone density measurement)  Completed   Hepatitis C Screening  Completed   Zoster (Shingles) Vaccine  Completed   HPV Vaccine  Aged Out    Advanced directives: (Copy Requested) Please bring a copy of your health care power of attorney and living will to the office to be added to your chart at your convenience.  Next Medicare Annual Wellness Visit scheduled for next year: Pt declined and doe snot want to have AWV next year, only visit with PCP.

## 2024-02-26 NOTE — Progress Notes (Signed)
 Subjective:   Melissa Blackwell is a 78 y.o. who presents for a Medicare Wellness preventive visit.  Visit Complete: Virtual I connected with  Melissa Blackwell on 02/26/24 by a audio enabled telemedicine application and verified that I am speaking with the correct person using two identifiers.  Patient Location: Home  Provider Location: Office/Clinic  I discussed the limitations of evaluation and management by telemedicine. The patient expressed understanding and agreed to proceed.  Vital Signs: Because this visit was a virtual/telehealth visit, some criteria may be missing or patient reported. Any vitals not documented were not able to be obtained and vitals that have been documented are patient reported.  VideoDeclined- This patient declined Librarian, academic. Therefore the visit was completed with audio only.  AWV Questionnaire: Yes: Patient Medicare AWV questionnaire was completed by the patient on 02/23/2024; I have confirmed that all information answered by patient is correct and no changes since this date.  Cardiac Risk Factors include: advanced age (>37men, >56 women);dyslipidemia     Objective:    Today's Vitals   02/26/24 1055  Weight: 130 lb (59 kg)  Height: 5\' 3"  (1.6 m)   Body mass index is 23.03 kg/m.     02/26/2024   11:01 AM 02/20/2023    3:35 PM 12/18/2018   12:27 PM 11/12/2016   11:16 AM  Advanced Directives  Does Patient Have a Medical Advance Directive? Yes Yes Yes Yes  Type of Estate agent of [place];Living will Healthcare Power of [place];Living will Healthcare Power of [place];Living will Healthcare Power of [place];Living will  Copy of Healthcare Power of Attorney in Chart? No - copy requested No - copy requested No - copy requested No - copy requested    Current Medications (verified) Outpatient Encounter Medications as of 02/26/2024  Medication Sig   Biotin 16109 MCG TABS Take by mouth.    Chlorophyll 100 MG TABS Take by mouth.   Cholecalciferol (VITAMIN D3) 5000 UNITS CAPS Take 5,000 Units by mouth daily.   KRILL OIL PO Take by mouth.   Multiple Vitamin (MULTIVITAMIN) capsule Take 1 capsule by mouth daily.   OVER THE COUNTER MEDICATION Take 2 tablets by mouth daily. Bone Restore   Probiotic Product (PROBIOTIC PO) Take 1 capsule by mouth daily.   RESVERATROL PO Take by mouth.   Turmeric (CURCUMIN 95 PO) Take by mouth.   No facility-administered encounter medications on file as of 02/26/2024.    Allergies (verified) Zetia [ezetimibe]   History: Past Medical History:  Diagnosis Date   Allergy    Cancer (HCC)    skin CA Hx of squamous cell in situ   Diverticulosis of sigmoid colon    mild   GI bleed    Hx of adenomatous colonic polyps    Hyperlipidemia    Internal hemorrhoids    Lyme disease 06/2007   Dr Abner Greenspan   Osteopenia    Renal cyst    right renal cyst   Thyroid nodule    Past Surgical History:  Procedure Laterality Date   ABDOMINAL HYSTERECTOMY  03/1996   partial fibroids   COLONOSCOPY     hemorrhoid banding     HEMORROIDECTOMY  04/2001   RHINOPLASTY     scar removal     keloid  scar   WRIST FRACTURE SURGERY     hardware  placed  and removed    Family History  Problem Relation Age of Onset   Cancer Mother  pancreatic CA   Heart disease Mother    Pancreatic cancer Mother    Heart disease Father        CHF and heart problems   Hyperlipidemia Sister    Cancer Sister        uterine CA   Uterine cancer Sister    Heart disease Maternal Grandmother    Colon polyps Daughter    Colon cancer Neg Hx    Esophageal cancer Neg Hx    Stomach cancer Neg Hx    Rectal cancer Neg Hx    Social History   Socioeconomic History   Marital status: Married    Spouse name: Not on file   Number of children: Not on file   Years of education: Not on file   Highest education level: Not on file  Occupational History   Not on file  Tobacco Use    Smoking status: Never   Smokeless tobacco: Never  Vaping Use   Vaping status: Never Used  Substance and Sexual Activity   Alcohol use: Yes    Alcohol/week: 0.0 standard drinks of alcohol    Comment: rarely - twice a year   Drug use: No   Sexual activity: Not on file  Other Topics Concern   Not on file  Social History Narrative   Not on file   Social Drivers of Health   Financial Resource Strain: Low Risk  (02/26/2024)   Overall Financial Resource Strain (CARDIA)    Difficulty of Paying Living Expenses: Not hard at all  Food Insecurity: No Food Insecurity (02/26/2024)   Hunger Vital Sign    Worried About Running Out of Food in the Last Year: Never true    Ran Out of Food in the Last Year: Never true  Transportation Needs: No Transportation Needs (02/26/2024)   PRAPARE - Administrator, Civil Service (Medical): No    Lack of Transportation (Non-Medical): No  Physical Activity: Sufficiently Active (02/26/2024)   Exercise Vital Sign    Days of Exercise per Week: 5 days    Minutes of Exercise per Session: 60 min  Stress: No Stress Concern Present (02/26/2024)   [employer] of Occupational Health - Occupational Stress Questionnaire    Feeling of Stress : Not at all  Social Connections: Moderately Integrated (02/26/2024)   Social Connection and Isolation Panel [NHANES]    Frequency of Communication with Friends and Family: More than three times a week    Frequency of Social Gatherings with Friends and Family: More than three times a week    Attends Religious Services: More than 4 times per year    Active Member of [employer] or Organizations: No    Attends Engineer, structural: Never    Marital Status: Married    Tobacco Counseling Counseling given: Not Answered    Clinical Intake:  Pre-visit preparation completed: Yes  Pain : No/denies pain     BMI - recorded: 23.03 Nutritional Status: BMI of 19-24  Normal Nutritional Risks: None Diabetes: No  How  often do you need to have someone help you when you read instructions, pamphlets, or other written materials from your doctor or pharmacy?: 1 - Never  Interpreter Needed?: No  Comments: lives with husband and two dogs Information entered by :: B.Jurrell Royster,LPN   Activities of Daily Living     02/23/2024    4:14 PM  In your present state of health, do you have any difficulty performing the following activities:  Hearing? 0  Vision? 0  Difficulty concentrating or making decisions? 0  Walking or climbing stairs? 0  Dressing or bathing? 0  Doing errands, shopping? 0  Preparing Food and eating ? N  Using the Toilet? N  In the past six months, have you accidently leaked urine? Y  Do you have problems with loss of bowel control? N  Managing your Medications? N  Managing your Finances? N  Housekeeping or managing your Housekeeping? N    Patient Care Team: [place], Audrie Gallus, MD as PCP - General  Indicate any recent Medical Services you may have received from other than [facility] providers in the past year (date may be approximate).     Assessment:   This is a routine wellness examination for [place].  Hearing/Vision screen Hearing Screening - Comments:: Pt says hearing is sufficient Vision Screening - Comments:: Pt says her vision is good     Goals Addressed             This Visit's Progress    Increase physical activity   On track    02/26/24- I will continue to exercise at least 50 min 2-3 days per week.      Patient Stated   On track    02/26/24-Would like to just maintain.       Depression Screen     02/26/2024   10:59 AM 02/20/2023    3:34 PM 02/19/2022    2:34 PM 01/15/2021    3:24 PM 01/04/2020    3:07 PM 12/18/2018   12:13 PM 12/10/2017   12:25 PM  PHQ 2/9 Scores  PHQ - 2 Score 0 0 0 0 0 0 0  PHQ- 9 Score      0     Fall Risk     02/23/2024    4:14 PM 02/20/2023    3:36 PM 02/19/2022    2:34 PM 01/15/2021    3:24 PM 01/04/2020    3:07 PM  Fall Risk   Falls in the  past year? 0 0 0 0 0  Number falls in past yr:  0     Injury with Fall?  0     Risk for fall due to : No Fall Risks No Fall Risks     Follow up Education provided;Falls prevention discussed Falls prevention discussed;Falls evaluation completed Falls evaluation completed Falls evaluation completed Falls evaluation completed    MEDICARE RISK AT HOME:  Medicare Risk at Home Any stairs in or around the home?: (Patient-Rptd) Yes If so, are there any without handrails?: (Patient-Rptd) No Home free of loose throw rugs in walkways, pet beds, electrical cords, etc?: (Patient-Rptd) No Adequate lighting in your home to reduce risk of falls?: (Patient-Rptd) Yes Life alert?: (Patient-Rptd) No Use of a cane, walker or w/c?: (Patient-Rptd) No Grab bars in the bathroom?: (Patient-Rptd) Yes Shower chair or bench in shower?: (Patient-Rptd) Yes Elevated toilet seat or a handicapped toilet?: (Patient-Rptd) No  TIMED UP AND GO:  Was the test performed?  No  Cognitive Function: 6CIT completed    12/18/2018   12:27 PM 11/12/2016   11:25 AM  MMSE - Mini Mental State Exam  Orientation to time 5 5  Orientation to Place 5 5  Registration 3 3  Attention/ Calculation 0 0  Recall 2 3  Recall-comments unable to recall 1 of 3 words   Language- name 2 objects 0 0  Language- repeat 1 1  Language- follow 3 step command 3 3  Language- read & follow direction  0 0  Write a sentence 0 0  Copy design 0 0  Total score 19 20        02/26/2024   11:02 AM 02/20/2023    3:38 PM  6CIT Screen  What Year? 0 points 0 points  What month? 0 points 0 points  What time? 0 points 0 points  Count back from 20 0 points 0 points  Months in reverse 0 points 0 points  Repeat phrase 0 points 0 points  Total Score 0 points 0 points    Immunizations Immunization History  Administered Date(s) Administered   Moderna Covid-19 Vaccine Bivalent Booster 36yrs & up 01/04/2022   Moderna Sars-Covid-2 Vaccination 01/13/2020,  02/16/2020, 10/22/2020, 07/10/2021   Td 03/29/2003, 10/27/2013   Zoster Recombinant(Shingrix) 01/12/2019, 05/25/2019   Zoster, Live 11/29/2015    Screening Tests Health Maintenance  Topic Date Due   Pneumonia Vaccine 85+ Years old (1 of 1 - PCV) Never done   INFLUENZA VACCINE  07/24/2023   COVID-19 Vaccine (6 - 2024-25 season) 08/24/2023   DTaP/Tdap/Td (3 - Tdap) 10/28/2023   Colonoscopy  11/24/2023   MAMMOGRAM  02/17/2025   Medicare Annual Wellness (AWV)  02/25/2025   DEXA SCAN  Completed   Hepatitis C Screening  Completed   Zoster Vaccines- Shingrix  Completed   HPV VACCINES  Aged Out    Health Maintenance  Health Maintenance Due  Topic Date Due   Pneumonia Vaccine 74+ Years old (1 of 1 - PCV) Never done   INFLUENZA VACCINE  07/24/2023   COVID-19 Vaccine (6 - 2024-25 season) 08/24/2023   DTaP/Tdap/Td (3 - Tdap) 10/28/2023   Colonoscopy  11/24/2023   Health Maintenance Items Addressed: none needed   Additional Screening:  Vision Screening: Recommended annual ophthalmology exams for early detection of glaucoma and other disorders of the eye.  Dental Screening: Recommended annual dental exams for proper oral hygiene  Community Resource Referral / Chronic Care Management: CRR required this visit?  No   CCM required this visit?  No     Plan:      I have personally reviewed and noted the following in the patient's chart:   Medical and social history Use of alcohol, tobacco or illicit drugs  Current medications and supplements including opioid prescriptions. Patient is not currently taking opioid prescriptions. Functional ability and status Nutritional status Physical activity Advanced directives List of other physicians Hospitalizations, surgeries, and ER visits in previous 12 months Vitals Screenings to include cognitive, depression, and falls Referrals and appointments  In addition, I have reviewed and discussed with patient certain preventive  protocols, quality metrics, and best practice recommendations. A written personalized care plan for preventive services as well as general preventive health recommendations were provided to patient.     Sue Lush, LPN   12/28/1094   After Visit Summary: (MyChart) Due to this being a telephonic visit, the after visit summary with patients personalized plan was offered to patient via MyChart   Notes: Pt has no concerns/questions at this time  Vaccinations: declines: Influenza vaccine: recommend every Fall Pneumococcal vaccine: recommend once per lifetime Prevnar-20 Covid-19: recommend 2 doses one month apart with a booster 6 months later (pt says she wants no more Covid boosters)

## 2024-03-04 ENCOUNTER — Ambulatory Visit (INDEPENDENT_AMBULATORY_CARE_PROVIDER_SITE_OTHER): Payer: Medicare Other | Admitting: Family Medicine

## 2024-03-04 ENCOUNTER — Encounter: Payer: Self-pay | Admitting: Family Medicine

## 2024-03-04 VITALS — BP 128/78 | HR 67 | Temp 98.5°F | Ht 63.0 in | Wt 135.1 lb

## 2024-03-04 DIAGNOSIS — Z860100 Personal history of colon polyps, unspecified: Secondary | ICD-10-CM | POA: Diagnosis not present

## 2024-03-04 DIAGNOSIS — E559 Vitamin D deficiency, unspecified: Secondary | ICD-10-CM | POA: Diagnosis not present

## 2024-03-04 DIAGNOSIS — M8589 Other specified disorders of bone density and structure, multiple sites: Secondary | ICD-10-CM | POA: Diagnosis not present

## 2024-03-04 DIAGNOSIS — E7800 Pure hypercholesterolemia, unspecified: Secondary | ICD-10-CM

## 2024-03-04 DIAGNOSIS — E2839 Other primary ovarian failure: Secondary | ICD-10-CM | POA: Diagnosis not present

## 2024-03-04 DIAGNOSIS — E041 Nontoxic single thyroid nodule: Secondary | ICD-10-CM

## 2024-03-04 DIAGNOSIS — Z85828 Personal history of other malignant neoplasm of skin: Secondary | ICD-10-CM | POA: Diagnosis not present

## 2024-03-04 NOTE — Assessment & Plan Note (Signed)
 Dexa ordered

## 2024-03-04 NOTE — Assessment & Plan Note (Signed)
 Vitamin D level is therapeutic with current supplementation Disc importance of this to bone and overall health Last vitamin D Lab Results  Component Value Date   VD25OH 65.76 02/24/2024

## 2024-03-04 NOTE — Patient Instructions (Addendum)
 If you want to update a tetanus shot - check with your pharmacy   For cholesterol  Avoid red meat/ fried foods/ fatty breakfast meats/ butter, cheese and high fat dairy/ and shellfish    Read about the coronary calcium scan  Call us if you want an order  It averages 200$   Call the GI office regarding repeat colonoscopy for polyps  [facility]  [phone]   You have an order for:  []   2D Mammogram  []   3D Mammogram  [x]   Bone Density     Please call for appointment:   []   [facility]  [address] [place] [address]  [phone]  []   [facility])   [address]  [place], [place] [address]  [phone]  [x]   [facility]      [address] [place], [place]        [phone]         []   [facility]  [address] [place], [place]  [phone]  []   [facility]   [address]   [place], [place] [address]  [phone]  []  [facility]  [address] [place], [place] [address] [phone]    Make sure to wear two piece clothing  No lotions powders or deodorants the day of the appointment Make sure to bring picture ID and insurance card.  Bring list of medications you are currently taking including any supplements.   Schedule your screening mammogram through MyChart!   Select [place] imaging sites can now be scheduled through MyChart.  Log into your MyChart account.  Go to 'Visit' (or 'Appointments' if  on mobile App) --> Schedule an  Appointment  Under 'Select a Reason for Visit' choose the Mammogram  Screening option.  Complete the pre-visit questions  and select the time and place that  best fits your schedule

## 2024-03-04 NOTE — Assessment & Plan Note (Signed)
 Due for 3 y recall colonoscopy  GI referral done

## 2024-03-04 NOTE — Assessment & Plan Note (Signed)
 Dexa ordered  One fall/no fracture Discussed fall prevention, supplements and exercise for bone density  Good exercise

## 2024-03-04 NOTE — Progress Notes (Signed)
 Subjective:    Patient ID: Melissa Blackwell, female    DOB: [birth year], 78 y.o.   MRN: [id number]  HPI  Here for annual follow up of chronic medical problems   Wt Readings from Last 3 Encounters:  03/04/24 135 lb 2 oz (61.3 kg)  02/26/24 130 lb (59 kg)  02/25/23 135 lb (61.2 kg)   23.94 kg/m  Vitals:   03/04/24 1100  BP: 128/78  Pulse: 67  Temp: 98.5 F (36.9 C)  SpO2: 96%    Immunization History  Administered Date(s) Administered   Moderna Covid-19 Vaccine Bivalent Booster 51yrs & up 01/04/2022   Moderna Sars-Covid-2 Vaccination 01/13/2020, 02/16/2020, 10/22/2020, 07/10/2021   Td 03/29/2003, 10/27/2013   Zoster Recombinant(Shingrix) 01/12/2019, 05/25/2019   Zoster, Live 11/29/2015    Health Maintenance Due  Topic Date Due   Pneumonia Vaccine 27+ Years old (1 of 1 - PCV) Never done   DTaP/Tdap/Td (3 - Tdap) 10/28/2023   Colonoscopy  11/24/2023   Feels fine   Tetanus shot was 2014   Declines flu shot  Declines pna vaccine   Mammogram 01/2024 Self breast exam- no lumps   Gyn health No problems     Colon cancer screening  colonoscopy 11/2020 with 3-5 y recall (3 y?)  Bone health  Dexa  05/2021 at the [facility] center  Osteopenia  Falls-fell over a barrier in her house / slid in bedroom shoes -last fall  Fractures-none  Supplements - vitamin D3 Last vitamin D Lab Results  Component Value Date   VD25OH 65.76 02/24/2024    Exercise : Pickleball twice weeky  Gym 3 days per week on average     Mood    02/26/2024   10:59 AM 02/20/2023    3:34 PM 02/19/2022    2:34 PM 01/15/2021    3:24 PM 01/04/2020    3:07 PM  Depression screen PHQ 2/9  Decreased Interest 0 0 0 0 0  Down, Depressed, Hopeless 0 0 0 0 0  PHQ - 2 Score 0 0 0 0 0   History of thyroid nodule in past Lab Results  Component Value Date   TSH 2.07 02/24/2024    Hyperlipidemia Lab Results  Component Value Date   CHOL 262 (H) 02/24/2024   CHOL 252 (H) 02/18/2023   CHOL 201 (H)  04/23/2022   Lab Results  Component Value Date   HDL 76.00 02/24/2024   HDL 76.80 02/18/2023   HDL 78.80 04/23/2022   Lab Results  Component Value Date   LDLCALC 167 (H) 02/24/2024   LDLCALC 156 (H) 02/18/2023   LDLCALC 110 (H) 04/23/2022   Lab Results  Component Value Date   TRIG 98.0 02/24/2024   TRIG 96.0 02/18/2023   TRIG 62.0 04/23/2022   Lab Results  Component Value Date   CHOLHDL 3 02/24/2024   CHOLHDL 3 02/18/2023   CHOLHDL 3 04/23/2022   Lab Results  Component Value Date   LDLDIRECT 124.9 10/14/2013   LDLDIRECT 148.5 10/07/2012   LDLDIRECT 129.0 10/10/2011    Not eating differently  LDL is up  [place] food 2 times per week  Beef - once per week  Fatty pork - one pc bacon per day  Fatty dairy -some cheese or ice cream daily  Some shellfish   Declines treatment  Declines a re check     Declines statin  Intol of zetia  The 10-year ASCVD risk score (Arnett DK, et al., 2019) is: 25%   Values used to calculate  the score:     Age: 56 years     Sex: Female     Is Non-Hispanic African American: No     Diabetic: No     Tobacco smoker: No     Systolic Blood Pressure: 128 mmHg     Is BP treated: No     HDL Cholesterol: 76 mg/dL     Total Cholesterol: 262 mg/dL      Patient Active Problem List   Diagnosis Date Noted   Thyroid nodule 01/04/2020   Estrogen deficiency 11/20/2016   Vitamin D deficiency 10/31/2014   Hyperlipidemia 10/31/2014   Diverticulosis of colon 08/23/2010   History of colonic polyps 08/23/2010   History of Lyme disease 08/10/2007   Hemorrhoids 07/21/2007   CONSTIPATION, CHRONIC 07/21/2007   Osteopenia 07/21/2007   RHEUMATOID FACTOR, POSITIVE 07/21/2007   SKIN CANCER, HX OF 07/21/2007   Past Medical History:  Diagnosis Date   Allergy    Cancer (HCC)    skin CA Hx of squamous cell in situ   Cataract    Not bad enough for removal   Diverticulosis of sigmoid colon    mild   GI bleed    Hx of adenomatous colonic polyps     Hyperlipidemia    Internal hemorrhoids    Lyme disease 06/2007   Dr Abner Greenspan   Osteopenia    Renal cyst    right renal cyst   Thyroid nodule    Past Surgical History:  Procedure Laterality Date   ABDOMINAL HYSTERECTOMY  03/1996   partial fibroids   COLONOSCOPY     COSMETIC SURGERY     Nose   FRACTURE SURGERY     Wrist   hemorrhoid banding     HEMORROIDECTOMY  04/2001   RHINOPLASTY     scar removal     keloid  scar   WRIST FRACTURE SURGERY     hardware  placed  and removed    Social History   Tobacco Use   Smoking status: Never   Smokeless tobacco: Never  Vaping Use   Vaping status: Never Used  Substance Use Topics   Alcohol use: Not Currently    Comment: Limited to vacations, holidays   Drug use: No   Family History  Problem Relation Age of Onset   Cancer Mother        pancreatic CA   Heart disease Mother    Pancreatic cancer Mother    Hearing loss Mother    Heart disease Father        CHF and heart problems   Hyperlipidemia Sister    Cancer Sister        uterine CA   Uterine cancer Sister    Heart disease Maternal Grandmother    Colon polyps Daughter    Colon cancer Neg Hx    Esophageal cancer Neg Hx    Stomach cancer Neg Hx    Rectal cancer Neg Hx    Allergies  Allergen Reactions   Zetia [Ezetimibe]     Hand pain    Current Outpatient Medications on File Prior to Visit  Medication Sig Dispense Refill   Biotin 69629 MCG TABS Take by mouth.     Chlorophyll 100 MG TABS Take by mouth.     Cholecalciferol (VITAMIN D3) 5000 UNITS CAPS Take 5,000 Units by mouth daily.     KRILL OIL PO Take by mouth.     Multiple Vitamin (MULTIVITAMIN) capsule Take 1 capsule by mouth daily.  OVER THE COUNTER MEDICATION Take 2 tablets by mouth daily. Bone Restore     Probiotic Product (PROBIOTIC PO) Take 1 capsule by mouth daily.     RESVERATROL PO Take by mouth.     Turmeric (CURCUMIN 95 PO) Take by mouth.     No current facility-administered medications on  file prior to visit.    Review of Systems  Constitutional:  Negative for activity change, appetite change, fatigue, fever and unexpected weight change.  HENT:  Negative for congestion, ear pain, rhinorrhea, sinus pressure and sore throat.   Eyes:  Negative for pain, redness and visual disturbance.  Respiratory:  Negative for cough, shortness of breath and wheezing.   Cardiovascular:  Negative for chest pain and palpitations.  Gastrointestinal:  Negative for abdominal pain, blood in stool, constipation and diarrhea.  Endocrine: Negative for polydipsia and polyuria.  Genitourinary:  Negative for dysuria, frequency and urgency.  Musculoskeletal:  Negative for arthralgias, back pain and myalgias.  Skin:  Negative for pallor and rash.  Allergic/Immunologic: Negative for environmental allergies.  Neurological:  Negative for dizziness, syncope and headaches.  Hematological:  Negative for adenopathy. Does not bruise/bleed easily.  Psychiatric/Behavioral:  Negative for decreased concentration and dysphoric mood. The patient is not nervous/anxious.        Objective:   Physical Exam Constitutional:      General: She is not in acute distress.    Appearance: Normal appearance. She is well-developed and normal weight. She is not ill-appearing or diaphoretic.  HENT:     Head: Normocephalic and atraumatic.     Right Ear: Tympanic membrane, ear canal and external ear normal.     Left Ear: Tympanic membrane, ear canal and external ear normal.     Nose: Nose normal. No congestion.     Mouth/Throat:     Mouth: Mucous membranes are moist.     Pharynx: Oropharynx is clear. No posterior oropharyngeal erythema.  Eyes:     General: No scleral icterus.    Extraocular Movements: Extraocular movements intact.     Conjunctiva/sclera: Conjunctivae normal.     Pupils: Pupils are equal, round, and reactive to light.  Neck:     Thyroid: No thyromegaly.     Vascular: No carotid bruit or JVD.     Comments: No  thyroid changes  Cardiovascular:     Rate and Rhythm: Normal rate and regular rhythm.     Pulses: Normal pulses.     Heart sounds: Normal heart sounds.     No gallop.  Pulmonary:     Effort: Pulmonary effort is normal. No respiratory distress.     Breath sounds: Normal breath sounds. No wheezing.     Comments: Good air exch Chest:     Chest wall: No tenderness.  Abdominal:     General: Bowel sounds are normal. There is no distension or abdominal bruit.     Palpations: Abdomen is soft. There is no mass.     Tenderness: There is no abdominal tenderness.     Hernia: No hernia is present.  Genitourinary:    Comments: Breast exam: No mass, nodules, thickening, tenderness, bulging, retraction, inflamation, nipple discharge or skin changes noted.  No axillary or clavicular LA.     Musculoskeletal:        General: No tenderness. Normal range of motion.     Cervical back: Normal range of motion and neck supple. No rigidity. No muscular tenderness.     Right lower leg: No edema.  Left lower leg: No edema.     Comments: No kyphosis   Lymphadenopathy:     Cervical: No cervical adenopathy.  Skin:    General: Skin is warm and dry.     Coloration: Skin is not pale.     Findings: No erythema or rash.     Comments: Solar lentigines diffusely Scattered sks   Neurological:     Mental Status: She is alert. Mental status is at baseline.     Cranial Nerves: No cranial nerve deficit.     Motor: No abnormal muscle tone.     Coordination: Coordination normal.     Gait: Gait normal.     Deep Tendon Reflexes: Reflexes are normal and symmetric. Reflexes normal.  Psychiatric:        Mood and Affect: Mood normal.        Cognition and Memory: Cognition and memory normal.           Assessment & Plan:   Problem List Items Addressed This Visit       Endocrine   Thyroid nodule   TSH stable No change in exam         Musculoskeletal and Integument   Osteopenia   Dexa ordered  One  fall/no fracture Discussed fall prevention, supplements and exercise for bone density  Good exercise         Other   Vitamin D deficiency   Vitamin D level is therapeutic with current supplementation Disc importance of this to bone and overall health Last vitamin D Lab Results  Component Value Date   VD25OH 65.76 02/24/2024         SKIN CANCER, HX OF   Encouraged use of sun protection       Hyperlipidemia - Primary   Disc goals for lipids and reasons to control them Rev last labs with pt Rev low sat fat diet in detail LDL is up  Eating a lot of trans and sat fats /not interested in diet change or re check or medication  Is possibly open to cardiac ca scan -given info and she will call if she wants to schedule       History of colonic polyps   Due for 3 y recall colonoscopy  GI referral done       Relevant Orders   Ambulatory referral to Gastroenterology   Estrogen deficiency   Dexa ordered       Relevant Orders   DG Bone Density

## 2024-03-04 NOTE — Assessment & Plan Note (Addendum)
 Encouraged use of sun protection

## 2024-03-04 NOTE — Assessment & Plan Note (Signed)
 Disc goals for lipids and reasons to control them Rev last labs with pt Rev low sat fat diet in detail LDL is up  Eating a lot of trans and sat fats /not interested in diet change or re check or medication  Is possibly open to cardiac ca scan -given info and she will call if she wants to schedule

## 2024-03-04 NOTE — Assessment & Plan Note (Signed)
 TSH stable No change in exam

## 2024-03-07 ENCOUNTER — Encounter: Payer: Self-pay | Admitting: Family Medicine

## 2024-03-07 DIAGNOSIS — E7800 Pure hypercholesterolemia, unspecified: Secondary | ICD-10-CM

## 2024-03-10 ENCOUNTER — Encounter: Payer: Self-pay | Admitting: *Deleted

## 2024-03-11 DIAGNOSIS — M9903 Segmental and somatic dysfunction of lumbar region: Secondary | ICD-10-CM | POA: Diagnosis not present

## 2024-03-11 DIAGNOSIS — S33140A Subluxation of L4/L5 lumbar vertebra, initial encounter: Secondary | ICD-10-CM | POA: Diagnosis not present

## 2024-03-29 ENCOUNTER — Encounter: Payer: Self-pay | Admitting: Family Medicine

## 2024-03-29 ENCOUNTER — Ambulatory Visit (HOSPITAL_COMMUNITY)
Admission: RE | Admit: 2024-03-29 | Discharge: 2024-03-29 | Disposition: A | Discharge status: Home / Self Care | Payer: Self-pay | Source: Ambulatory Visit | Attending: Family Medicine | Admitting: Family Medicine

## 2024-03-29 DIAGNOSIS — E7800 Pure hypercholesterolemia, unspecified: Secondary | ICD-10-CM | POA: Insufficient documentation

## 2024-03-30 DIAGNOSIS — L820 Inflamed seborrheic keratosis: Secondary | ICD-10-CM | POA: Diagnosis not present

## 2024-03-30 DIAGNOSIS — L649 Androgenic alopecia, unspecified: Secondary | ICD-10-CM | POA: Diagnosis not present

## 2024-03-30 DIAGNOSIS — L821 Other seborrheic keratosis: Secondary | ICD-10-CM | POA: Diagnosis not present

## 2024-03-30 DIAGNOSIS — L905 Scar conditions and fibrosis of skin: Secondary | ICD-10-CM | POA: Diagnosis not present

## 2024-03-30 DIAGNOSIS — Z85828 Personal history of other malignant neoplasm of skin: Secondary | ICD-10-CM | POA: Diagnosis not present

## 2024-03-30 DIAGNOSIS — D1801 Hemangioma of skin and subcutaneous tissue: Secondary | ICD-10-CM | POA: Diagnosis not present

## 2024-04-22 DIAGNOSIS — M9903 Segmental and somatic dysfunction of lumbar region: Secondary | ICD-10-CM | POA: Diagnosis not present

## 2024-04-22 DIAGNOSIS — S33140A Subluxation of L4/L5 lumbar vertebra, initial encounter: Secondary | ICD-10-CM | POA: Diagnosis not present

## 2024-05-27 ENCOUNTER — Ambulatory Visit (INDEPENDENT_AMBULATORY_CARE_PROVIDER_SITE_OTHER): Admitting: Physician Assistant

## 2024-05-27 ENCOUNTER — Encounter: Payer: Self-pay | Admitting: Physician Assistant

## 2024-05-27 ENCOUNTER — Telehealth: Payer: Self-pay | Admitting: Physician Assistant

## 2024-05-27 VITALS — BP 120/80 | HR 60 | Ht 63.0 in | Wt 135.0 lb

## 2024-05-27 DIAGNOSIS — Z860101 Personal history of adenomatous and serrated colon polyps: Secondary | ICD-10-CM

## 2024-05-27 DIAGNOSIS — K5909 Other constipation: Secondary | ICD-10-CM

## 2024-05-27 NOTE — Telephone Encounter (Signed)
 Patient called to schedule an appointment for a colonoscopy but stated that she spoke to a scheduler and they rudely told her that we did not have a calendar out for September and November. Patient is wanting to speak with some regarding scheduling an appointment that and be offered to her in September or November. Please advise.

## 2024-05-27 NOTE — Progress Notes (Signed)
 Chief Complaint: Discuss colonoscopy  HPI:    Melissa Blackwell is a 78 year old female with a past medical history as listed below including adenomatous polyps known to Dr. Leonia Raman, who was referred to me by Tower, Manley Seeds, MD for discussion of a repeat colonoscopy.     11/23/2020 colonoscopy done for surveillance with a history of colonic polyps in 2002, 2007 and 2011 with a finding of one 5 mm polyp in the ascending colon, three 1-2 mm polyps in the transverse colon, moderate diverticulosis in the sigmoid, descending, transverse and a send in colon as well as melanosis in the colon and nonbleeding internal hemorrhoids.  Pathology showed adenomatous polyps and repeat recommended in 3 years.    02/24/24 CBC, CMP, vitamin D  and TSH all normal.    Today, patient presents to clinic and tells me she would like to have 1 more colonoscopy given her history of so many polyps.  She is feeling well with no new medical problems.  She remains on supplements which she seems to think helped her.  Uses an herbal laxative every couple of nights in order to maintain regular bowel movements which she has done for years.  No new complaints or concerns.    Denies fever, chills, weight loss, change in bowel habits, chest pain or shortness of breath.  Past Medical History:  Diagnosis Date   Allergy    Cancer (HCC)    skin CA Hx of squamous cell in situ   Cataract    Not bad enough for removal   Diverticulosis of sigmoid colon    mild   GI bleed    Hx of adenomatous colonic polyps    Hyperlipidemia    Internal hemorrhoids    Lyme disease 06/2007   Dr Claressa Crock   Osteopenia    Renal cyst    right renal cyst   Thyroid  nodule     Past Surgical History:  Procedure Laterality Date   ABDOMINAL HYSTERECTOMY  03/1996   partial fibroids   COLONOSCOPY     COSMETIC SURGERY     Nose   FRACTURE SURGERY     Wrist   hemorrhoid banding     HEMORROIDECTOMY  04/2001   RHINOPLASTY     scar removal     keloid  scar    WRIST FRACTURE SURGERY     hardware  placed  and removed     Current Outpatient Medications  Medication Sig Dispense Refill   Biotin 29562 MCG TABS Take by mouth.     Chlorophyll 100 MG TABS Take by mouth.     Cholecalciferol (VITAMIN D3) 5000 UNITS CAPS Take 5,000 Units by mouth daily.     KRILL OIL PO Take by mouth.     Multiple Vitamin (MULTIVITAMIN) capsule Take 1 capsule by mouth daily.     OVER THE COUNTER MEDICATION Take 2 tablets by mouth daily. Bone Restore     Probiotic Product (PROBIOTIC PO) Take 1 capsule by mouth daily.     RESVERATROL PO Take by mouth.     Turmeric (CURCUMIN 95 PO) Take by mouth.     No current facility-administered medications for this visit.    Allergies as of 05/27/2024 - Review Complete 05/27/2024  Allergen Reaction Noted   Zetia  [ezetimibe ]  02/25/2023    Family History  Problem Relation Age of Onset   Cancer Mother        pancreatic CA   Heart disease Mother    Pancreatic cancer Mother  Hearing loss Mother    Heart disease Father        CHF and heart problems   Hyperlipidemia Sister    Cancer Sister        uterine CA   Uterine cancer Sister    Heart disease Maternal Grandmother    Colon polyps Daughter    Colon cancer Neg Hx    Esophageal cancer Neg Hx    Stomach cancer Neg Hx    Rectal cancer Neg Hx     Social History   Socioeconomic History   Marital status: Married    Spouse name: Not on file   Number of children: Not on file   Years of education: Not on file   Highest education level: Some college, no degree  Occupational History   Not on file  Tobacco Use   Smoking status: Never   Smokeless tobacco: Never  Vaping Use   Vaping status: Never Used  Substance and Sexual Activity   Alcohol use: Not Currently    Comment: Limited to vacations, holidays   Drug use: No   Sexual activity: Not Currently  Other Topics Concern   Not on file  Social History Narrative   Not on file   Social Drivers of Health    Financial Resource Strain: Low Risk  (03/01/2024)   Overall Financial Resource Strain (CARDIA)    Difficulty of Paying Living Expenses: Not hard at all  Food Insecurity: No Food Insecurity (03/01/2024)   Hunger Vital Sign    Worried About Running Out of Food in the Last Year: Never true    Ran Out of Food in the Last Year: Never true  Transportation Needs: No Transportation Needs (03/01/2024)   PRAPARE - Administrator, Civil Service (Medical): No    Lack of Transportation (Non-Medical): No  Physical Activity: Sufficiently Active (03/01/2024)   Exercise Vital Sign    Days of Exercise per Week: 3 days    Minutes of Exercise per Session: 60 min  Stress: No Stress Concern Present (03/01/2024)   [employer] of Occupational Health - Occupational Stress Questionnaire    Feeling of Stress : Only a little  Social Connections: Socially Integrated (03/01/2024)   Social Connection and Isolation Panel [NHANES]    Frequency of Communication with Friends and Family: Three times a week    Frequency of Social Gatherings with Friends and Family: Twice a week    Attends Religious Services: More than 4 times per year    Active Member of [employer] or Organizations: Yes    Attends Engineer, structural: More than 4 times per year    Marital Status: Married  Catering manager Violence: Not At Risk (02/26/2024)   Humiliation, Afraid, Rape, and Kick questionnaire    Fear of Current or Ex-Partner: No    Emotionally Abused: No    Physically Abused: No    Sexually Abused: No    Review of Systems:    Constitutional: No weight loss, fever or chills Skin: No rash Cardiovascular: No chest pain   Respiratory: No SOB  Gastrointestinal: See HPI and otherwise negative Genitourinary: No dysuria  Neurological: No headache, dizziness or syncope Musculoskeletal: No new muscle or joint pain Hematologic: No bleeding  Psychiatric: No history of depression or anxiety   Physical Exam:  Vital  signs: BP 120/80   Pulse 60   Ht 5\' 3"  (1.6 m)   Wt 135 lb (61.2 kg)   BMI 23.91 kg/m    Constitutional:  Pleasant Elderly Caucasian female appears to be in NAD, Well developed, Well nourished, alert and cooperative Head:  Normocephalic and atraumatic. Eyes:   PEERL, EOMI. No icterus. Conjunctiva pink. Ears:  Normal auditory acuity. Neck:  Supple Throat: Oral cavity and pharynx without inflammation, swelling or lesion.  Respiratory: Respirations even and unlabored. Lungs clear to auscultation bilaterally.   No wheezes, crackles, or rhonchi.  Cardiovascular: Normal S1, S2. No MRG. Regular rate and rhythm. No peripheral edema, cyanosis or pallor.  Gastrointestinal:  Soft, nondistended, nontender. No rebound or guarding.  Decreased bowel sounds all 4 quadrants. No appreciable masses or hepatomegaly. Rectal:  Not performed.  Msk:  Symmetrical without gross deformities. Without edema, no deformity or joint abnormality.  Neurologic:  Alert and  oriented x4;  grossly normal neurologically.  Skin:   Dry and intact without significant lesions or rashes. Psychiatric: Demonstrates good judgement and reason without abnormal affect or behaviors.  RELEVANT LABS AND IMAGING: CBC    Component Value Date/Time   WBC 4.4 02/24/2024 1018   RBC 4.51 02/24/2024 1018   HGB 13.2 02/24/2024 1018   HCT 39.8 02/24/2024 1018   PLT 198.0 02/24/2024 1018   MCV 88.2 02/24/2024 1018   MCHC 33.2 02/24/2024 1018   RDW 14.1 02/24/2024 1018   LYMPHSABS 1.3 02/24/2024 1018   MONOABS 0.4 02/24/2024 1018   EOSABS 0.1 02/24/2024 1018   BASOSABS 0.0 02/24/2024 1018    CMP     Component Value Date/Time   NA 142 02/24/2024 1018   K 4.3 02/24/2024 1018   CL 106 02/24/2024 1018   CO2 29 02/24/2024 1018   GLUCOSE 93 02/24/2024 1018   BUN 15 02/24/2024 1018   CREATININE 0.70 02/24/2024 1018   CALCIUM 9.5 02/24/2024 1018   PROT 6.9 02/24/2024 1018   ALBUMIN 4.4 02/24/2024 1018   AST 24 02/24/2024 1018   ALT  18 02/24/2024 1018   ALKPHOS 85 02/24/2024 1018   BILITOT 0.5 02/24/2024 1018   GFRNONAA 93.76 08/09/2010 0944   GFRAA 93 08/10/2007 1016    Assessment: 1.  History of adenomatous polyps: Last colonoscopy in 2021 with repeat recommended 3 years, patient is in good health and would like to have 1 further colonoscopy for surveillance 2.  Chronic constipation: Helped by herbal laxatives  Plan: 1.  Tried to schedule the patient for surveillance colonoscopy in the [facility] with Dr. Leonia Raman, but she is requesting September which we do not have out yet.  We will call her back to set up.  Did provide the patient in detail this risk procedure and she agrees to proceed.  We discussed how these risks are slightly higher given her age.  Did discuss with the patient that if she does not get this done within the next 90 days she will have to come back in for an office visit prior to scheduling again. 2.  Continue herbal laxative 3.  Did discuss a 2-day bowel prep, but she has never had to do this before and she would like to avoid this.  She did request a written prescription for her bowel prep which she was given today. 4.  Patient to follow in clinic per recommendations from Dr. Leonia Raman after time of procedure.  Reginal Capra, PA-C [facility] 05/27/2024, 10:50 AM  Cc: Tower, Manley Seeds, MD

## 2024-05-27 NOTE — Patient Instructions (Signed)
 It has been recommended to you by your physician that you have a(n) colonscopy completed. Per your request, we did not schedule the procedure(s) today. Please contact our office at [phone] should you decide to have the procedure completed. You will be scheduled for a pre-visit and procedure at that time.

## 2024-05-28 NOTE — Telephone Encounter (Signed)
 Called and spoke with patient. She reports that she is trying to get a colonoscopy with Dr. Leonia Raman the first 2 weeks of September. Advised patient that we do not have the schedule built for September at this time. Recommended patient call back closer to the end of July to schedule. Did advise patient that per [place] Dr. Leonia Raman will be on vacation the first week of September and she will be in the hospital the second week of September. Patient is trying to schedule around her daughter being in town, so reports she may have to schedule for October or November.

## 2024-06-03 DIAGNOSIS — S33140A Subluxation of L4/L5 lumbar vertebra, initial encounter: Secondary | ICD-10-CM | POA: Diagnosis not present

## 2024-06-03 DIAGNOSIS — S13130A Subluxation of C2/C3 cervical vertebrae, initial encounter: Secondary | ICD-10-CM | POA: Diagnosis not present

## 2024-06-03 DIAGNOSIS — M9901 Segmental and somatic dysfunction of cervical region: Secondary | ICD-10-CM | POA: Diagnosis not present

## 2024-06-03 DIAGNOSIS — M9903 Segmental and somatic dysfunction of lumbar region: Secondary | ICD-10-CM | POA: Diagnosis not present

## 2024-07-15 DIAGNOSIS — H2513 Age-related nuclear cataract, bilateral: Secondary | ICD-10-CM | POA: Diagnosis not present

## 2024-07-15 DIAGNOSIS — H04123 Dry eye syndrome of bilateral lacrimal glands: Secondary | ICD-10-CM | POA: Diagnosis not present

## 2024-07-15 DIAGNOSIS — H40013 Open angle with borderline findings, low risk, bilateral: Secondary | ICD-10-CM | POA: Diagnosis not present

## 2024-07-15 DIAGNOSIS — H5203 Hypermetropia, bilateral: Secondary | ICD-10-CM | POA: Diagnosis not present

## 2024-07-22 DIAGNOSIS — S33140A Subluxation of L4/L5 lumbar vertebra, initial encounter: Secondary | ICD-10-CM | POA: Diagnosis not present

## 2024-07-22 DIAGNOSIS — M9901 Segmental and somatic dysfunction of cervical region: Secondary | ICD-10-CM | POA: Diagnosis not present

## 2024-07-22 DIAGNOSIS — S13130A Subluxation of C2/C3 cervical vertebrae, initial encounter: Secondary | ICD-10-CM | POA: Diagnosis not present

## 2024-07-22 DIAGNOSIS — M9903 Segmental and somatic dysfunction of lumbar region: Secondary | ICD-10-CM | POA: Diagnosis not present

## 2024-09-02 DIAGNOSIS — M9903 Segmental and somatic dysfunction of lumbar region: Secondary | ICD-10-CM | POA: Diagnosis not present

## 2024-09-02 DIAGNOSIS — S33140A Subluxation of L4/L5 lumbar vertebra, initial encounter: Secondary | ICD-10-CM | POA: Diagnosis not present

## 2024-09-02 DIAGNOSIS — M9901 Segmental and somatic dysfunction of cervical region: Secondary | ICD-10-CM | POA: Diagnosis not present

## 2024-09-02 DIAGNOSIS — S13160A Subluxation of C5/C6 cervical vertebrae, initial encounter: Secondary | ICD-10-CM | POA: Diagnosis not present

## 2024-09-28 DIAGNOSIS — L820 Inflamed seborrheic keratosis: Secondary | ICD-10-CM | POA: Diagnosis not present

## 2024-09-28 DIAGNOSIS — L649 Androgenic alopecia, unspecified: Secondary | ICD-10-CM | POA: Diagnosis not present

## 2024-09-28 DIAGNOSIS — L821 Other seborrheic keratosis: Secondary | ICD-10-CM | POA: Diagnosis not present

## 2024-09-28 DIAGNOSIS — D485 Neoplasm of uncertain behavior of skin: Secondary | ICD-10-CM | POA: Diagnosis not present

## 2024-09-28 DIAGNOSIS — L905 Scar conditions and fibrosis of skin: Secondary | ICD-10-CM | POA: Diagnosis not present

## 2024-09-28 DIAGNOSIS — Z85828 Personal history of other malignant neoplasm of skin: Secondary | ICD-10-CM | POA: Diagnosis not present

## 2024-09-28 DIAGNOSIS — D1801 Hemangioma of skin and subcutaneous tissue: Secondary | ICD-10-CM | POA: Diagnosis not present

## 2024-09-28 DIAGNOSIS — L720 Epidermal cyst: Secondary | ICD-10-CM | POA: Diagnosis not present

## 2024-10-04 DIAGNOSIS — H903 Sensorineural hearing loss, bilateral: Secondary | ICD-10-CM | POA: Diagnosis not present

## 2024-10-04 DIAGNOSIS — H6093 Unspecified otitis externa, bilateral: Secondary | ICD-10-CM | POA: Diagnosis not present

## 2024-10-04 DIAGNOSIS — H6123 Impacted cerumen, bilateral: Secondary | ICD-10-CM | POA: Diagnosis not present

## 2024-10-14 DIAGNOSIS — S33140A Subluxation of L4/L5 lumbar vertebra, initial encounter: Secondary | ICD-10-CM | POA: Diagnosis not present

## 2024-10-14 DIAGNOSIS — M9901 Segmental and somatic dysfunction of cervical region: Secondary | ICD-10-CM | POA: Diagnosis not present

## 2024-10-14 DIAGNOSIS — S13160A Subluxation of C5/C6 cervical vertebrae, initial encounter: Secondary | ICD-10-CM | POA: Diagnosis not present

## 2024-10-14 DIAGNOSIS — M9903 Segmental and somatic dysfunction of lumbar region: Secondary | ICD-10-CM | POA: Diagnosis not present

## 2024-11-04 ENCOUNTER — Other Ambulatory Visit

## 2024-11-04 ENCOUNTER — Ambulatory Visit (HOSPITAL_BASED_OUTPATIENT_CLINIC_OR_DEPARTMENT_OTHER)
Admission: RE | Admit: 2024-11-04 | Discharge: 2024-11-04 | Disposition: A | Discharge status: Home / Self Care | Source: Ambulatory Visit | Attending: Family Medicine | Admitting: Family Medicine

## 2024-11-04 ENCOUNTER — Ambulatory Visit: Payer: Self-pay | Admitting: Family Medicine

## 2024-11-04 DIAGNOSIS — E2839 Other primary ovarian failure: Secondary | ICD-10-CM | POA: Insufficient documentation

## 2024-11-04 DIAGNOSIS — Z780 Asymptomatic menopausal state: Secondary | ICD-10-CM | POA: Diagnosis not present

## 2024-11-25 DIAGNOSIS — S33140A Subluxation of L4/L5 lumbar vertebra, initial encounter: Secondary | ICD-10-CM | POA: Diagnosis not present

## 2024-11-25 DIAGNOSIS — S13160A Subluxation of C5/C6 cervical vertebrae, initial encounter: Secondary | ICD-10-CM | POA: Diagnosis not present

## 2024-11-25 DIAGNOSIS — M9903 Segmental and somatic dysfunction of lumbar region: Secondary | ICD-10-CM | POA: Diagnosis not present

## 2024-11-25 DIAGNOSIS — M9901 Segmental and somatic dysfunction of cervical region: Secondary | ICD-10-CM | POA: Diagnosis not present

## 2024-12-06 ENCOUNTER — Other Ambulatory Visit: Payer: Self-pay | Admitting: Medical Genetics

## 2024-12-08 ENCOUNTER — Inpatient Hospital Stay (HOSPITAL_COMMUNITY)
Admission: RE | Admit: 2024-12-08 | Discharge: 2024-12-08 | Payer: Self-pay | Attending: Medical Genetics | Admitting: Medical Genetics

## 2024-12-13 ENCOUNTER — Ambulatory Visit

## 2024-12-13 ENCOUNTER — Telehealth: Payer: Self-pay

## 2024-12-13 VITALS — Ht 63.0 in | Wt 130.0 lb

## 2024-12-13 DIAGNOSIS — Z860100 Personal history of colon polyps, unspecified: Secondary | ICD-10-CM

## 2024-12-13 NOTE — Progress Notes (Signed)
 No egg or soy allergy known to patient  No issues known to pt with past sedation with any surgeries or procedures Patient denies ever being told they had issues or difficulty with intubation  No FH of Malignant Hyperthermia Pt is not on diet pills Pt is not on  home 02  Pt is not on blood thinners  Pt has issues with constipation; takes herbal medications almost daily and was instructed to continue this  No A fib or A flutter Have any cardiac testing pending--No Pt can ambulate  Pt denies use of chewing tobacco Discussed diabetic I weight loss medication holds Discussed NSAID holds Checked BMI Pt instructed to use Singlecare.com or GoodRx for a price reduction on prep  Patient's chart reviewed by Norleen Schillings CNRA prior to previsit and patient appropriate for the [facility].  Pre visit completed and red dot placed by patient's name on their procedure day (on provider's schedule).

## 2024-12-13 NOTE — Telephone Encounter (Signed)
 Good Morning Dr. Nandigam,  I did a PV on this patient today, she is scheduled for a colonoscopy w/ you on 1/7 ([facility]).  She is wanting the Clenpiq bowel prep (her daughter told her to ask for it).  This prep is not listed as one of your approved bowel preps.  Are you ok with her receiving this prep? Please advise and thank you, Kimarie Coor

## 2024-12-14 NOTE — Telephone Encounter (Signed)
 Its doesn't work as well as other preps based on my experience, prefer to avoid it

## 2024-12-15 MED ORDER — NA SULFATE-K SULFATE-MG SULF 17.5-3.13-1.6 GM/177ML PO SOLN: 1.0000 | Freq: Once | ORAL | 0 refills | Status: AC

## 2024-12-15 NOTE — Telephone Encounter (Signed)
 Patient returned call.  Suprep sent to her pharmacy and GoodRX coupon sent to her cell phone number.  Bowel prep instructions reviewed and patient verbalized understanding. Denies further needs.

## 2024-12-21 LAB — GENECONNECT MOLECULAR SCREEN: Genetic Analysis Overall Interpretation: NEGATIVE

## 2024-12-29 ENCOUNTER — Ambulatory Visit: Admitting: Gastroenterology

## 2024-12-29 ENCOUNTER — Encounter: Payer: Self-pay | Admitting: Gastroenterology

## 2024-12-29 VITALS — BP 146/77 | HR 60 | Temp 98.0°F | Resp 13 | Ht 63.0 in | Wt 130.0 lb

## 2024-12-29 DIAGNOSIS — K5909 Other constipation: Secondary | ICD-10-CM

## 2024-12-29 DIAGNOSIS — K648 Other hemorrhoids: Secondary | ICD-10-CM | POA: Diagnosis not present

## 2024-12-29 DIAGNOSIS — Z1211 Encounter for screening for malignant neoplasm of colon: Secondary | ICD-10-CM | POA: Diagnosis not present

## 2024-12-29 DIAGNOSIS — D122 Benign neoplasm of ascending colon: Secondary | ICD-10-CM

## 2024-12-29 DIAGNOSIS — Z860101 Personal history of adenomatous and serrated colon polyps: Secondary | ICD-10-CM | POA: Diagnosis not present

## 2024-12-29 DIAGNOSIS — K5730 Diverticulosis of large intestine without perforation or abscess without bleeding: Secondary | ICD-10-CM

## 2024-12-29 DIAGNOSIS — Z860100 Personal history of colon polyps, unspecified: Secondary | ICD-10-CM

## 2024-12-29 MED ORDER — SODIUM CHLORIDE 0.9 % IV SOLN: 500.0000 mL | Freq: Once | INTRAVENOUS | Status: DC

## 2024-12-29 NOTE — Progress Notes (Signed)
 Transferred to PACU via stretcher. Patient arousing to stimulation.  VSS upon leaving procedure room.

## 2024-12-29 NOTE — Progress Notes (Signed)
 Called to room to assist during endoscopic procedure.  Patient ID and intended procedure confirmed with present staff. Received instructions for my participation in the procedure from the performing physician.

## 2024-12-29 NOTE — Progress Notes (Signed)
Pt. states no medical or surgical changes since previsit or office visit. 

## 2024-12-29 NOTE — Op Note (Signed)
 [facility] Patient Name: Melissa Blackwell Procedure Date: 12/29/2024 10:12 AM MRN: [id number] Endoscopist: Gustav ALONSO Mcgee , MD, [id number] Age: 79 Referring MD:  Date of Birth: Sep 20, 1946 Gender: Female Account #: [id number] Procedure:                Colonoscopy Indications:              High risk colon cancer surveillance: Personal                            history of colonic polyps, High risk colon cancer                            surveillance: Personal history of multiple (3 or                            more) adenomas Medicines:                Monitored Anesthesia Care Procedure:                Pre-Anesthesia Assessment:                           - Prior to the procedure, a History and Physical                            was performed, and patient medications and                            allergies were reviewed. The patient's tolerance of                            previous anesthesia was also reviewed. The risks                            and benefits of the procedure and the sedation                            options and risks were discussed with the patient.                            All questions were answered, and informed consent                            was obtained. Prior Anticoagulants: The patient has                            taken no anticoagulant or antiplatelet agents. ASA                            Grade Assessment: II - A patient with mild systemic                            disease. After reviewing the risks and benefits,  the patient was deemed in satisfactory condition to                            undergo the procedure.                           After obtaining informed consent, the colonoscope                            was passed under direct vision. Throughout the                            procedure, the patient's blood pressure, pulse, and                            oxygen saturations were monitored  continuously. The                            Olympus Scope SN: [id number] was introduced through                            the anus and advanced to the the cecum, identified                            by appendiceal orifice and ileocecal valve. The                            colonoscopy was performed without difficulty. The                            patient tolerated the procedure well. The quality                            of the bowel preparation was good. The ileocecal                            valve, appendiceal orifice, and rectum were                            photographed. Scope In: 10:29:56 AM Scope Out: 10:49:25 AM Scope Withdrawal Time: 0 hours 15 minutes 20 seconds  Total Procedure Duration: 0 hours 19 minutes 29 seconds  Findings:                 The perianal and digital rectal examinations were                            normal.                           A 5 mm polyp was found in the ascending colon. The                            polyp was sessile. The polyp was removed with a  cold snare. Resection and retrieval were complete.                           A 1 mm polyp was found in the ascending colon. The                            polyp was sessile. The polyp was removed with a                            cold biopsy forceps. Resection and retrieval were                            complete.                           Scattered large-mouthed, medium-mouthed and                            small-mouthed diverticula were found in the sigmoid                            colon, descending colon, transverse colon and                            ascending colon.                           Non-bleeding external and internal hemorrhoids were                            found during retroflexion. The hemorrhoids were                            medium-sized. Complications:            No immediate complications. Estimated Blood Loss:     Estimated blood loss was  minimal. Impression:               - One 5 mm polyp in the ascending colon, removed                            with a cold snare. Resected and retrieved.                           - One 1 mm polyp in the ascending colon, removed                            with a cold biopsy forceps. Resected and retrieved.                           - Moderate diverticulosis in the sigmoid colon, in                            the descending colon, in the transverse colon and  in the ascending colon.                           - Non-bleeding external and internal hemorrhoids. Recommendation:           - Resume previous diet.                           - Continue present medications.                           - Await pathology results.                           - No repeat colonoscopy due to age. Arlina Sabina V. Jago Carton, MD 12/29/2024 11:09:34 AM This report has been signed electronically.

## 2024-12-29 NOTE — Patient Instructions (Addendum)
 Resume previous diet  Continue present medications  Await pathology results  See handouts on diverticulosis, hemorrhoids and polyps  YOU HAD AN ENDOSCOPIC PROCEDURE TODAY AT THE [facility]:   Refer to the procedure report that was given to you for any specific questions about what was found during the examination.  If the procedure report does not answer your questions, please call your gastroenterologist to clarify.  If you requested that your care partner not be given the details of your procedure findings, then the procedure report has been included in a sealed envelope for you to review at your convenience later.  YOU SHOULD EXPECT: Some feelings of bloating in the abdomen. Passage of more gas than usual.  Walking can help get rid of the air that was put into your GI tract during the procedure and reduce the bloating. If you had a lower endoscopy (such as a colonoscopy or flexible sigmoidoscopy) you may notice spotting of blood in your stool or on the toilet paper. If you underwent a bowel prep for your procedure, you may not have a normal bowel movement for a few days.  Please Note:  You might notice some irritation and congestion in your nose or some drainage.  This is from the oxygen used during your procedure.  There is no need for concern and it should clear up in a day or so.  SYMPTOMS TO REPORT IMMEDIATELY: Following lower endoscopy (colonoscopy or flexible sigmoidoscopy):  Excessive amounts of blood in the stool  Significant tenderness or worsening of abdominal pains  Swelling of the abdomen that is new, acute  Fever of 100F or higher  For urgent or emergent issues, a gastroenterologist can be reached at any hour by calling [phone]. Do not use MyChart messaging for urgent concerns.   DIET:  We do recommend a small meal at first, but then you may proceed to your regular diet.  Drink plenty of fluids but you should avoid alcoholic beverages for 24  hours.  ACTIVITY:  You should plan to take it easy for the rest of today and you should NOT DRIVE or use heavy machinery until tomorrow (because of the sedation medicines used during the test).    FOLLOW UP: Our staff will call the number listed on your records the next business day following your procedure.  We will call around 7:15- 8:00 am to check on you and address any questions or concerns that you may have regarding the information given to you following your procedure. If we do not reach you, we will leave a message.     If any biopsies were taken you will be contacted by phone or by letter within the next 1-3 weeks.  Please call us  at [phone] if you have not heard about the biopsies in 3 weeks.   SIGNATURES/CONFIDENTIALITY: You and/or your care partner have signed paperwork which will be entered into your electronic medical record.  These signatures attest to the fact that that the information above on your After Visit Summary has been reviewed and is understood.  Full responsibility of the confidentiality of this discharge information lies with you and/or your care-partner.

## 2024-12-29 NOTE — Progress Notes (Signed)
 [facility] History and Physical   Primary Care Physician:  Tower, Laine LABOR, MD   Reason for Procedure:  History of adenomatous colon polyps  Plan:    Surveillance colonoscopy with possible interventions as needed     HPI: Melissa Blackwell is a very pleasant 79 y.o. female here for surveillance colonoscopy. Denies any nausea, vomiting, abdominal pain, melena or bright red blood per rectum  The risks and benefits as well as alternatives of endoscopic procedure(s) have been discussed and reviewed.  The patient was provided an opportunity to ask questions and all were answered. The patient agreed with the plan and demonstrated an understanding of the instructions.   Past Medical History:  Diagnosis Date   Allergy    Cancer (HCC)    skin CA Hx of squamous cell in situ   Cataract    Not bad enough for removal   Diverticulosis of sigmoid colon    mild   GI bleed    Hx of adenomatous colonic polyps    Hyperlipidemia    Internal hemorrhoids    Lyme disease 06/2007   Dr Severo   Osteopenia    Renal cyst    right renal cyst   Thyroid  nodule     Past Surgical History:  Procedure Laterality Date   ABDOMINAL HYSTERECTOMY  03/1996   partial fibroids   COLONOSCOPY     COSMETIC SURGERY     Nose   FRACTURE SURGERY     Wrist   hemorrhoid banding     HEMORROIDECTOMY  04/2001   RHINOPLASTY     scar removal     keloid  scar   WRIST FRACTURE SURGERY     hardware  placed  and removed     Prior to Admission medications  Medication Sig Start Date End Date Taking? Authorizing Provider  acyclovir (ZOVIRAX) 400 MG tablet Take 400 mg by mouth as needed.    [provider]  Biotin 89999 MCG TABS Take by mouth.    [provider]  Chlorophyll 100 MG TABS Take by mouth.    [provider]  Cholecalciferol (VITAMIN D3) 5000 UNITS CAPS Take 5,000 Units by mouth daily.    [provider]  KRILL OIL PO Take by mouth.    [provider]  Multiple Vitamin (MULTIVITAMIN) capsule Take 1 capsule by mouth daily.    [provider]  OVER THE COUNTER MEDICATION Take 2 tablets by mouth daily. Bone Restore    [provider]  Probiotic Product (PROBIOTIC PO) Take 1 capsule by mouth daily.    [provider]  RESVERATROL PO Take by mouth.    [provider]  Turmeric (CURCUMIN 95 PO) Take by mouth.    [provider]    Current Outpatient Medications  Medication Sig Dispense Refill   acyclovir (ZOVIRAX) 400 MG tablet Take 400 mg by mouth as needed.     Biotin 89999 MCG TABS Take by mouth.     Chlorophyll 100 MG TABS Take by mouth.     Cholecalciferol (VITAMIN D3) 5000 UNITS CAPS Take 5,000 Units by mouth daily.     KRILL OIL PO Take by mouth.     Multiple Vitamin (MULTIVITAMIN) capsule Take 1 capsule by mouth daily.     OVER THE COUNTER MEDICATION Take 2 tablets by mouth daily. Bone Restore     Probiotic Product (PROBIOTIC PO) Take 1 capsule by mouth daily.     RESVERATROL PO Take by mouth.  Turmeric (CURCUMIN 95 PO) Take by mouth.     Current Facility-Administered Medications  Medication Dose Route Frequency Provider Last Rate Last Admin   0.9 %  sodium chloride  infusion  500 mL Intravenous Once Keveon Amsler V, MD        Allergies as of 12/29/2024 - Review Complete 12/13/2024  Allergen Reaction Noted   Zetia  [ezetimibe ] Other (See Comments) 02/25/2023    Family History  Problem Relation Age of Onset   Cancer Mother        pancreatic CA   Heart disease Mother    Pancreatic cancer Mother    Hearing loss Mother    Heart disease Father        CHF and heart problems   Hyperlipidemia Sister    Cancer Sister        uterine CA   Uterine cancer Sister    Heart disease Maternal Grandmother    Colon polyps Daughter    Colon cancer Neg Hx    Esophageal cancer Neg Hx    Stomach cancer Neg Hx    Rectal cancer Neg Hx     Social History   Socioeconomic History    Marital status: Married    Spouse name: Not on file   Number of children: Not on file   Years of education: Not on file   Highest education level: Some college, no degree  Occupational History   Not on file  Tobacco Use   Smoking status: Never   Smokeless tobacco: Never  Vaping Use   Vaping status: Never Used  Substance and Sexual Activity   Alcohol use: Not Currently    Comment: Limited to vacations, holidays   Drug use: No   Sexual activity: Not Currently  Other Topics Concern   Not on file  Social History Narrative   Not on file   Social Drivers of Health   Tobacco Use: Low Risk (12/13/2024)   Patient History    Smoking Tobacco Use: Never    Smokeless Tobacco Use: Never    Passive Exposure: Not on file  Financial Resource Strain: Low Risk (03/01/2024)   Overall Financial Resource Strain (CARDIA)    Difficulty of Paying Living Expenses: Not hard at all  Food Insecurity: No Food Insecurity (03/01/2024)   Hunger Vital Sign    Worried About Running Out of Food in the Last Year: Never true    Ran Out of Food in the Last Year: Never true  Transportation Needs: No Transportation Needs (03/01/2024)   PRAPARE - Administrator, Civil Service (Medical): No    Lack of Transportation (Non-Medical): No  Physical Activity: Sufficiently Active (03/01/2024)   Exercise Vital Sign    Days of Exercise per Week: 3 days    Minutes of Exercise per Session: 60 min  Stress: No Stress Concern Present (03/01/2024)   [employer] of Occupational Health - Occupational Stress Questionnaire    Feeling of Stress : Only a little  Social Connections: Socially Integrated (03/01/2024)   Social Connection and Isolation Panel    Frequency of Communication with Friends and Family: Three times a week    Frequency of Social Gatherings with Friends and Family: Twice a week    Attends Religious Services: More than 4 times per year    Active Member of [employer] or Organizations: Yes    Attends Probation Officer: More than 4 times per year    Marital Status: Married  Catering Manager Violence: Not At Risk (  02/26/2024)   Humiliation, Afraid, Rape, and Kick questionnaire    Fear of Current or Ex-Partner: No    Emotionally Abused: No    Physically Abused: No    Sexually Abused: No  Depression (PHQ2-9): Low Risk (02/26/2024)   Depression (PHQ2-9)    PHQ-2 Score: 0  Alcohol Screen: Low Risk (03/01/2024)   Alcohol Screen    Last Alcohol Screening Score (AUDIT): 1  Housing: Unknown (03/01/2024)   Housing Stability Vital Sign    Unable to Pay for Housing in the Last Year: No    Number of Times Moved in the Last Year: Not on file    Homeless in the Last Year: No  Utilities: Not At Risk (02/20/2023)   [facility] Utilities    Threatened with loss of utilities: No  Health Literacy: Not on file    Review of Systems:  All other review of systems negative except as mentioned in the HPI.  Physical Exam: Vital signs in last 24 hours: BP 136/79   Pulse 67   Temp 98 F (36.7 C) (Skin)   Ht 5' 3 (1.6 m)   Wt 130 lb (59 kg)   SpO2 100%   BMI 23.03 kg/m  General:   Alert, NAD Lungs:  Clear .   Heart:  Regular rate and rhythm Abdomen:  Soft, nontender and nondistended. Neuro/Psych:  Alert and cooperative. Normal mood and affect. A and O x 3  Reviewed labs, radiology imaging, old records and pertinent past GI work up  Patient is appropriate for planned procedure(s) and anesthesia in an ambulatory setting   K. Veena Desarae Placide , MD [phone]

## 2024-12-30 ENCOUNTER — Telehealth: Payer: Self-pay

## 2024-12-30 NOTE — Telephone Encounter (Signed)
" °  Follow up Call-     12/29/2024   10:01 AM  Call back number  Post procedure Call Back phone  # [phone]  Permission to leave phone message Yes     Patient questions:  Do you have a fever, pain , or abdominal swelling? No. Pain Score  0 *  Have you tolerated food without any problems? Yes.    Have you been able to return to your normal activities? Yes.    Do you have any questions about your discharge instructions: Diet   No. Medications  No. Follow up visit  No.  Do you have questions or concerns about your Care? No.  Actions: * If pain score is 4 or above: No action needed, pain <4.   "

## 2024-12-31 LAB — SURGICAL PATHOLOGY

## 2025-01-03 ENCOUNTER — Ambulatory Visit: Payer: Self-pay | Admitting: Gastroenterology

## 2025-02-25 ENCOUNTER — Other Ambulatory Visit

## 2025-02-28 ENCOUNTER — Ambulatory Visit

## 2025-03-01 ENCOUNTER — Ambulatory Visit

## 2025-03-04 ENCOUNTER — Ambulatory Visit: Admitting: Family Medicine

## 2025-03-08 ENCOUNTER — Other Ambulatory Visit

## 2025-03-15 ENCOUNTER — Ambulatory Visit: Admitting: Family Medicine
